# Patient Record
Sex: Female | Born: 1985 | Race: White | Hispanic: No | Marital: Single | Smoking: Never smoker
Health system: Southern US, Community
[De-identification: ages and names within clinical notes are randomized; demographics above are authoritative.]

## PROBLEM LIST (undated history)

## (undated) DIAGNOSIS — F909 Attention-deficit hyperactivity disorder, unspecified type: Secondary | ICD-10-CM

## (undated) HISTORY — PX: TONSILLECTOMY: SUR1361

## (undated) HISTORY — PX: APPENDECTOMY: SHX54

## (undated) HISTORY — PX: ABDOMINAL SURGERY: SHX537

---

## 2005-01-02 ENCOUNTER — Emergency Department: Payer: Self-pay | Admitting: Emergency Medicine

## 2008-10-28 ENCOUNTER — Inpatient Hospital Stay (HOSPITAL_COMMUNITY): Admission: AD | Admit: 2008-10-28 | Discharge: 2008-10-29 | Payer: Self-pay | Admitting: Obstetrics & Gynecology

## 2009-01-31 ENCOUNTER — Inpatient Hospital Stay (HOSPITAL_COMMUNITY): Admission: AD | Admit: 2009-01-31 | Discharge: 2009-02-03 | Payer: Self-pay | Admitting: Obstetrics and Gynecology

## 2009-03-15 ENCOUNTER — Emergency Department: Payer: Self-pay | Admitting: Emergency Medicine

## 2009-09-20 ENCOUNTER — Emergency Department: Payer: Self-pay | Admitting: Emergency Medicine

## 2009-12-17 ENCOUNTER — Ambulatory Visit (HOSPITAL_COMMUNITY): Admission: RE | Admit: 2009-12-17 | Discharge: 2009-12-17 | Payer: Self-pay | Admitting: Obstetrics

## 2010-01-04 ENCOUNTER — Emergency Department: Payer: Self-pay | Admitting: Emergency Medicine

## 2010-01-23 ENCOUNTER — Inpatient Hospital Stay (HOSPITAL_COMMUNITY): Admission: AD | Admit: 2010-01-23 | Discharge: 2010-01-23 | Payer: Self-pay | Admitting: Obstetrics

## 2010-01-23 ENCOUNTER — Ambulatory Visit: Payer: Self-pay | Admitting: Advanced Practice Midwife

## 2010-02-15 ENCOUNTER — Encounter: Admission: RE | Admit: 2010-02-15 | Discharge: 2010-02-15 | Payer: Self-pay | Admitting: Diagnostic Neuroimaging

## 2010-04-09 ENCOUNTER — Inpatient Hospital Stay (HOSPITAL_COMMUNITY): Admission: AD | Admit: 2010-04-09 | Discharge: 2010-04-09 | Payer: Self-pay | Admitting: Obstetrics

## 2010-04-09 ENCOUNTER — Ambulatory Visit: Payer: Self-pay | Admitting: Nurse Practitioner

## 2010-04-30 ENCOUNTER — Inpatient Hospital Stay (HOSPITAL_COMMUNITY): Admission: AD | Admit: 2010-04-30 | Discharge: 2010-05-01 | Payer: Self-pay | Admitting: Obstetrics

## 2010-05-19 ENCOUNTER — Inpatient Hospital Stay (HOSPITAL_COMMUNITY): Admission: RE | Admit: 2010-05-19 | Discharge: 2010-05-21 | Payer: Self-pay | Admitting: Obstetrics

## 2010-07-21 ENCOUNTER — Emergency Department: Payer: Self-pay | Admitting: Emergency Medicine

## 2010-11-14 ENCOUNTER — Encounter: Payer: Self-pay | Admitting: Diagnostic Neuroimaging

## 2011-01-08 LAB — CBC
HCT: 31.9 % — ABNORMAL LOW (ref 36.0–46.0)
Hemoglobin: 10.3 g/dL — ABNORMAL LOW (ref 12.0–15.0)
Hemoglobin: 11.1 g/dL — ABNORMAL LOW (ref 12.0–15.0)
MCHC: 34.7 g/dL (ref 30.0–36.0)
MCHC: 34.8 g/dL (ref 30.0–36.0)
Platelets: 140 10*3/uL — ABNORMAL LOW (ref 150–400)
RBC: 3.19 MIL/uL — ABNORMAL LOW (ref 3.87–5.11)
RBC: 3.5 MIL/uL — ABNORMAL LOW (ref 3.87–5.11)
RDW: 15.5 % (ref 11.5–15.5)

## 2011-01-09 ENCOUNTER — Emergency Department: Payer: Self-pay | Admitting: Emergency Medicine

## 2011-01-09 LAB — URINALYSIS, ROUTINE W REFLEX MICROSCOPIC
Ketones, ur: NEGATIVE mg/dL
Protein, ur: NEGATIVE mg/dL
Specific Gravity, Urine: >1.03 — ABNORMAL HIGH (ref 1.005–1.030)
Urobilinogen, UA: 0.2 mg/dL (ref 0.0–1.0)
pH: 6 (ref 5.0–8.0)

## 2011-01-09 LAB — URINE MICROSCOPIC-ADD ON

## 2011-01-09 LAB — WET PREP, GENITAL: Yeast Wet Prep HPF POC: NONE SEEN

## 2011-01-09 LAB — GC/CHLAMYDIA PROBE AMP, GENITAL
Chlamydia, DNA Probe: NEGATIVE
GC Probe Amp, Genital: NEGATIVE

## 2011-01-12 LAB — URINALYSIS, ROUTINE W REFLEX MICROSCOPIC
Glucose, UA: NEGATIVE mg/dL
Hgb urine dipstick: NEGATIVE
Protein, ur: NEGATIVE mg/dL
Specific Gravity, Urine: 1.01 (ref 1.005–1.030)
Urobilinogen, UA: 0.2 mg/dL (ref 0.0–1.0)

## 2011-01-12 LAB — WET PREP, GENITAL
Clue Cells Wet Prep HPF POC: NONE SEEN
Yeast Wet Prep HPF POC: NONE SEEN

## 2011-02-02 LAB — CBC
Hemoglobin: 10.4 g/dL — ABNORMAL LOW (ref 12.0–15.0)
MCHC: 34 g/dL (ref 30.0–36.0)
MCV: 94.2 fL (ref 78.0–100.0)
RBC: 3.23 MIL/uL — ABNORMAL LOW (ref 3.87–5.11)
RDW: 14.2 % (ref 11.5–15.5)
RDW: 14.3 % (ref 11.5–15.5)
WBC: 7 10*3/uL (ref 4.0–10.5)

## 2011-02-02 LAB — CCBB MATERNAL DONOR DRAW

## 2011-02-07 LAB — URINALYSIS, ROUTINE W REFLEX MICROSCOPIC
Glucose, UA: NEGATIVE mg/dL
Protein, ur: NEGATIVE mg/dL
Specific Gravity, Urine: >1.03 — ABNORMAL HIGH (ref 1.005–1.030)
pH: 6 (ref 5.0–8.0)

## 2011-03-08 NOTE — H&P (Signed)
Christy Mcdonald, Christy Mcdonald              ACCOUNT NO.:  [id number]   MEDICAL RECORD NO.:  [id number]          PATIENT TYPE:  INP   LOCATION:  9162                          FACILITY:  [facility]   PHYSICIAN:  Lenoard Aden, M.D.DATE OF BIRTH:  05/18/1986   DATE OF ADMISSION:  01/31/2009  DATE OF DISCHARGE:                              HISTORY & PHYSICAL   INDICATION FOR INDUCTION:  Postdates and presumed estimated fetal weight  of approximately 9 pounds.  She is a 25 year old Caucasian female, G1,  P0 at 79 plus weeks' gestation, who presents for cervical ripening and  induction.   SOCIAL HISTORY:  She has no known drug allergies.   MEDICATIONS:  Prenatal vitamins.   SOCIAL HISTORY:  She is a nonsmoker and nondrinker.  She denies domestic  or physical violence.   FAMILY HISTORY:  Hypertension, breast cancer, skin cancer, gallbladder  disease, seizure disorder, stroke, bronchitis, and COPD.   PAST MEDICAL HISTORY:  She also has a history of 2 previous TAbs in 2007  and 2009.  She is a G3, P0.   PHYSICAL EXAMINATION:  GENERAL:  She is a well-developed, well-nourished  white female in no acute distress.  HEENT:  Normal.  NECK:  Supple.  LUNGS:  Clear to auscultation.  HEART:  Regular rhythm.  ABDOMEN:  Soft, gravid, and nontender.  Estimated fetal weight 9 pounds.  Cervix is 1 cm, 70%, and vertex -2.  EXTREMITIES:  There are no cords.  NEUROLOGIC:  Nonfocal.  SKIN:  Intact.   IMPRESSION:  1. A 40 plus week intrauterine pregnancy.  2. Presumed large for gestational age.   PLAN:  Proceed with cervical ripening and induction.  Anticipate  cautious attempts at labor, and possible vaginal delivery.     Lenoard Aden, M.D.  Electronically Signed    RJT/MEDQ  D:  01/31/2009  T:  02/01/2009  Job:  [id number]

## 2011-03-10 ENCOUNTER — Emergency Department: Payer: Self-pay | Admitting: Emergency Medicine

## 2011-11-30 ENCOUNTER — Emergency Department: Payer: Self-pay | Admitting: Emergency Medicine

## 2013-01-19 ENCOUNTER — Emergency Department (HOSPITAL_COMMUNITY)
Admission: EM | Admit: 2013-01-19 | Discharge: 2013-01-19 | Disposition: A | Discharge status: Home / Self Care | Payer: 59 | Attending: Emergency Medicine | Admitting: Emergency Medicine

## 2013-01-19 ENCOUNTER — Encounter (HOSPITAL_COMMUNITY): Payer: Self-pay | Admitting: *Deleted

## 2013-01-19 DIAGNOSIS — G43909 Migraine, unspecified, not intractable, without status migrainosus: Secondary | ICD-10-CM | POA: Insufficient documentation

## 2013-01-19 DIAGNOSIS — H9209 Otalgia, unspecified ear: Secondary | ICD-10-CM | POA: Insufficient documentation

## 2013-01-19 DIAGNOSIS — H53149 Visual discomfort, unspecified: Secondary | ICD-10-CM | POA: Insufficient documentation

## 2013-01-19 MED ORDER — IBUPROFEN 800 MG PO TABS: 800.0000 mg | ORAL_TABLET | Freq: Three times a day (TID) | ORAL | Status: DC

## 2013-01-19 MED ORDER — DIPHENHYDRAMINE HCL 50 MG/ML IJ SOLN
25.0000 mg | Freq: Once | INTRAMUSCULAR | Status: AC
  Administered 2013-01-19: 25 mg via INTRAMUSCULAR
  Filled 2013-01-19: qty 1

## 2013-01-19 MED ORDER — PROCHLORPERAZINE EDISYLATE 5 MG/ML IJ SOLN: 10.0000 mg | Freq: Four times a day (QID) | INTRAMUSCULAR | Status: DC | PRN

## 2013-01-19 MED ORDER — SODIUM CHLORIDE 0.9 % IV BOLUS (SEPSIS)
1000.0000 mL | Freq: Once | INTRAVENOUS | Status: AC
  Administered 2013-01-19: 1000 mL via INTRAVENOUS

## 2013-01-19 MED ORDER — DEXAMETHASONE SODIUM PHOSPHATE 10 MG/ML IJ SOLN
10.0000 mg | Freq: Once | INTRAMUSCULAR | Status: AC
  Administered 2013-01-19: 10 mg via INTRAMUSCULAR
  Filled 2013-01-19: qty 1

## 2013-01-19 MED ORDER — KETOROLAC TROMETHAMINE 60 MG/2ML IM SOLN
60.0000 mg | Freq: Once | INTRAMUSCULAR | Status: AC
  Administered 2013-01-19: 60 mg via INTRAMUSCULAR
  Filled 2013-01-19: qty 2

## 2013-01-19 NOTE — ED Notes (Signed)
Headache for 3 hours no n v

## 2013-01-19 NOTE — ED Provider Notes (Signed)
History     CSN: [id number]  Arrival date & time 01/19/13  0310   First MD Initiated Contact with Patient 01/19/13 [phone]      Chief Complaint  Patient presents with  . Headache    (Consider location/radiation/quality/duration/timing/severity/associated sxs/prior treatment) Patient is a 27 y.o. female presenting with headaches. The history is provided by the patient. No language interpreter was used.  Headache Pain location:  R temporal and R parietal Quality:  Sharp and stabbing Radiates to:  Does not radiate Severity currently:  9/10 Severity at highest:  10/10 Onset quality:  Sudden Duration:  3 hours Timing:  Constant Progression:  Unchanged Chronicity:  Recurrent Similar to prior headaches: no (pt states much worse than normal)   Context: bright light   Relieved by:  Nothing Worsened by:  Light Ineffective treatments:  Acetaminophen Associated symptoms: ear pain ( right ear pain) and photophobia   Associated symptoms: no abdominal pain, no back pain, no blurred vision, no congestion, no cough, no diarrhea, no pain, no fatigue, no fever, no focal weakness, no hearing loss, no loss of balance, no myalgias, no nausea, no neck pain, no neck stiffness, no seizures, no sinus pressure, no sore throat, no swollen glands, no syncope, no visual change, no vomiting and no weakness   Pt with hx of migraines presented today after 3hr hx of right sided HA worse than other migraines in the past.  Pt was sitting in bed when HA began.  Pain is 10/10, sharp and throbbing in nature, causing right ear to hurt.  Tried acetaminophen w/o relief.  Denies fever, chills, n/v/d.  Denies neck pain. Denies change in vision or loss of balance.  History reviewed. No pertinent past medical history.  History reviewed. No pertinent past surgical history.  No family history on file.  History  Substance Use Topics  . Smoking status: Never Smoker   . Smokeless tobacco: Not on file  . Alcohol Use: Yes     OB History   Grav Para Term Preterm Abortions TAB SAB Ect Mult Living                  Review of Systems  Constitutional: Negative for fever and fatigue.  HENT: Positive for ear pain ( right ear pain). Negative for hearing loss, congestion, sore throat, neck pain, neck stiffness and sinus pressure.   Eyes: Positive for photophobia. Negative for blurred vision and pain.  Respiratory: Negative for cough.   Cardiovascular: Negative for syncope.  Gastrointestinal: Negative for nausea, vomiting, abdominal pain and diarrhea.  Musculoskeletal: Negative for myalgias and back pain.  Neurological: Positive for headaches. Negative for focal weakness, seizures and loss of balance.    Allergies  Review of patient's allergies indicates no known allergies.  Home Medications   Current Outpatient Rx  Name  Route  Sig  Dispense  Refill  . ibuprofen (ADVIL,MOTRIN) 800 MG tablet   Oral   Take 1 tablet (800 mg total) by mouth 3 (three) times daily.   21 tablet   0     BP 139/86  Pulse 80  Temp(Src) 97.7 F (36.5 C) (Oral)  Resp 18  SpO2 97%  Physical Exam  Nursing note and vitals reviewed. Constitutional: She is oriented to person, place, and time. She appears well-developed and well-nourished. No distress.  HENT:  Head: Normocephalic and atraumatic.  Right Ear: External ear normal.  Left Ear: External ear normal.  Mouth/Throat: Oropharynx is clear and moist. No oropharyngeal exudate.  Eyes: Conjunctivae  and EOM are normal. Pupils are equal, round, and reactive to light. No scleral icterus.  Neck: Normal range of motion. Neck supple. No tracheal deviation present. No thyromegaly present.  Cardiovascular: Normal rate, regular rhythm and normal heart sounds.   Pulmonary/Chest: Effort normal and breath sounds normal. No respiratory distress. She has no wheezes.  Abdominal: Soft. Bowel sounds are normal. She exhibits no distension. There is no tenderness.  Musculoskeletal: Normal  range of motion.  Lymphadenopathy:    She has no cervical adenopathy.  Neurological: She is alert and oriented to person, place, and time. She has normal reflexes. She displays normal reflexes. No cranial nerve deficit. Coordination normal.  Skin: Skin is warm and dry. No rash noted. She is not diaphoretic. No erythema.    ED Course  Procedures (including critical care time)  Labs Reviewed - No data to display No results found.   1. Migraine       MDM  Pt presenting with 3hr hx of right sided HA.  Pt has hx of migraines but states this is much worse.  Was lying in bed when HA began. Also c/o right ear pain.  Has taken acetaminophen with no relief.  Denies fever, chills, n/v/d.  No visual change.  Pt A&Ox3.  Pt uses Mirena for birth control, not OCP.  No nuchal rigidity.  Afebrile. No rash. Eyes: PERRL, EOM in tact Neuro: nl, CN II-XII in tact, neg romberg, nl gait, nl coordination  Giving pain meds: decadron, compazine, and benadryl.   Not likely to be SAH or intracranial due to no neuro deficits or hx of trauma.   Most likely typical migraine.  Will reassess.  5:44 AM Pt visibly upset, crying in bed.  Will give fluid bolus and Toradol.  5:44 AM Pt states she is feeling a lot better.  Pain went from 10/10 to 4/10.  Will let fluids continue and discharge pt home.  Will have pt f/u with PCP for migraines.  Advised pt to rest and stay hydrated.  Important to keep stress under control.    Dx: migraine  Rx: ibuprofen  Vitals: unremarkable. Discharged in stable condition.    Discussed pt with attending during ED encounter.           Junius Finner, PA-C 01/19/13 [phone]

## 2013-02-06 NOTE — ED Provider Notes (Signed)
Medical screening examination/treatment/procedure(s) were performed by non-physician practitioner and as supervising physician I was immediately available for consultation/collaboration.  Brandt Loosen, MD 02/06/13 [phone]

## 2013-03-21 ENCOUNTER — Emergency Department (HOSPITAL_COMMUNITY)
Admission: EM | Admit: 2013-03-21 | Discharge: 2013-03-22 | Disposition: A | Discharge status: Home / Self Care | Payer: 59 | Attending: Emergency Medicine | Admitting: Emergency Medicine

## 2013-03-21 ENCOUNTER — Encounter (HOSPITAL_COMMUNITY): Payer: Self-pay

## 2013-03-21 DIAGNOSIS — N83209 Unspecified ovarian cyst, unspecified side: Secondary | ICD-10-CM | POA: Insufficient documentation

## 2013-03-21 DIAGNOSIS — Z3202 Encounter for pregnancy test, result negative: Secondary | ICD-10-CM | POA: Insufficient documentation

## 2013-03-21 NOTE — ED Notes (Signed)
Pt states that she's having severe pelvic pain and she thinks its her IUD, she's unable to find the string

## 2013-03-22 ENCOUNTER — Emergency Department (HOSPITAL_COMMUNITY): Payer: 59

## 2013-03-22 LAB — GC/CHLAMYDIA PROBE AMP: GC Probe RNA: NEGATIVE

## 2013-03-22 LAB — URINALYSIS, ROUTINE W REFLEX MICROSCOPIC
Glucose, UA: NEGATIVE mg/dL
Ketones, ur: NEGATIVE mg/dL
Leukocytes, UA: NEGATIVE
Specific Gravity, Urine: 1.031 — ABNORMAL HIGH (ref 1.005–1.030)
Urobilinogen, UA: 0.2 mg/dL (ref 0.0–1.0)
pH: 5 (ref 5.0–8.0)

## 2013-03-22 LAB — PREGNANCY, URINE: Preg Test, Ur: NEGATIVE

## 2013-03-22 MED ORDER — OXYCODONE-ACETAMINOPHEN 5-325 MG PO TABS
2.0000 | ORAL_TABLET | Freq: Once | ORAL | Status: AC
  Administered 2013-03-22: 2 via ORAL
  Filled 2013-03-22: qty 2

## 2013-03-22 MED ORDER — HYDROCODONE-ACETAMINOPHEN 5-325 MG PO TABS: 1.0000 | ORAL_TABLET | ORAL | Status: DC | PRN

## 2013-03-22 MED ORDER — ONDANSETRON 4 MG PO TBDP
4.0000 mg | ORAL_TABLET | Freq: Once | ORAL | Status: AC
  Administered 2013-03-22: 4 mg via ORAL
  Filled 2013-03-22: qty 1

## 2013-03-22 MED ORDER — ONDANSETRON HCL 4 MG PO TABS: 4.0000 mg | ORAL_TABLET | Freq: Four times a day (QID) | ORAL | Status: DC

## 2013-03-22 NOTE — ED Provider Notes (Signed)
Medical screening examination/treatment/procedure(s) were performed by non-physician practitioner and as supervising physician I was immediately available for consultation/collaboration.  Olivia Mackie, MD 03/22/13 [phone]

## 2013-03-22 NOTE — ED Provider Notes (Signed)
History     CSN: [id number]  Arrival date & time 03/21/13  2327   First MD Initiated Contact with Patient 03/22/13 0011      Chief Complaint  Patient presents with  . Pelvic Pain    (Consider location/radiation/quality/duration/timing/severity/associated sxs/prior treatment) HPI  Patient to the ER with complaints of acute pelvic pain that started a couple of hours before arriving to the ED. She has an IUD and is unable to feel the string which has her concerned that it is out of place. She denies having discharge, vaginal bleeding, dysuria, vomiting, diarrhea, upper abdominal pains. nad vss   History reviewed. No pertinent past medical history.  History reviewed. No pertinent past surgical history.  History reviewed. No pertinent family history.  History  Substance Use Topics  . Smoking status: Never Smoker   . Smokeless tobacco: Not on file  . Alcohol Use: Yes    OB History   Grav Para Term Preterm Abortions TAB SAB Ect Mult Living                  Review of Systems  Genitourinary: Positive for pelvic pain.  All other systems reviewed and are negative.    Allergies  Review of patient's allergies indicates no known allergies.  Home Medications   Current Outpatient Rx  Name  Route  Sig  Dispense  Refill  . ibuprofen (ADVIL,MOTRIN) 200 MG tablet   Oral   Take 400-800 mg by mouth every 6 (six) hours as needed for pain.         Marland Kitchen HYDROcodone-acetaminophen (NORCO/VICODIN) 5-325 MG per tablet   Oral   Take 1 tablet by mouth every 4 (four) hours as needed for pain.   10 tablet   0   . ondansetron (ZOFRAN) 4 MG tablet   Oral   Take 1 tablet (4 mg total) by mouth every 6 (six) hours.   12 tablet   0     BP 125/74  Pulse 65  Temp(Src) 98.4 F (36.9 C) (Oral)  Resp 16  SpO2 95%  Physical Exam  Nursing note and vitals reviewed. Constitutional: She appears well-developed and well-nourished. No distress.  HENT:  Head: Normocephalic and atraumatic.   Eyes: Pupils are equal, round, and reactive to light.  Neck: Normal range of motion. Neck supple.  Cardiovascular: Normal rate and regular rhythm.   Pulmonary/Chest: Effort normal.  Abdominal: Soft.  Genitourinary: Vagina normal and uterus normal. Cervix exhibits no motion tenderness, no discharge and no friability. No tenderness around the vagina. No vaginal discharge found.  Neurological: She is alert.  Skin: Skin is warm and dry.    ED Course  Procedures (including critical care time)  Labs Reviewed  WET PREP, GENITAL - Abnormal; Notable for the following:    Clue Cells Wet Prep HPF POC RARE (*)    WBC, Wet Prep HPF POC RARE (*)    All other components within normal limits  URINALYSIS, ROUTINE W REFLEX MICROSCOPIC - Abnormal; Notable for the following:    Specific Gravity, Urine 1.031 (*)    All other components within normal limits  GC/CHLAMYDIA PROBE AMP  PREGNANCY, URINE   US Transvaginal Non-ob  03/22/2013   *RADIOLOGY REPORT*  Clinical Data: Pelvic pain.  TRANSABDOMINAL AND TRANSVAGINAL ULTRASOUND OF PELVIS DOPPLER ULTRASOUND OF OVARIES  Technique:  Both transabdominal and transvaginal ultrasound examinations of the pelvis were performed. Transabdominal technique was performed for global imaging of the pelvis including uterus, ovaries, adnexal regions, and pelvic cul-de-sac.  Color and duplex Doppler ultrasound was utilized to evaluate blood flow to the ovaries.  It was necessary to proceed with endovaginal exam following the transabdominal exam to visualize the uterus and ovaries in greater detail.  Comparison:  Pelvic ultrasound performed 12/17/2009  Findings:  Uterus: Normal in size and appearance; measures 9.8 x 3.5 x 5.5 cm.  Endometrium: Normal in thickness and appearance; measures 0.6 cm in thickness. An intrauterine device is noted in appropriate position.  Right ovary:  Normal appearance/no adnexal mass; measures 3.1 x 2.3 x 2.4 cm.  Left ovary: Normal appearance; measures  4.8 x 2.8 x 3.0 cm.  There is a 2.5 x 1.9 x 1.5 cm irregular cyst with debris, possibly reflecting a recently ruptured cyst.  Pulsed Doppler evaluation demonstrates normal low-resistance arterial and venous waveforms in both ovaries.  Other findings:  A moderate amount of free fluid is noted within the pelvis.  IMPRESSION:  1.  2.5 cm somewhat irregular cyst with debris noted at the left ovary, possibly reflecting a recently ruptured cyst, with a moderate amount of free fluid noted in the pelvis. 2.  Otherwise unremarkable pelvic ultrasound.  Intrauterine device noted in expected position.  No evidence for ovarian torsion.   Original Report Authenticated By: Tonia Ghent, M.D.   US Pelvis Complete  03/22/2013   *RADIOLOGY REPORT*  Clinical Data: Pelvic pain.  TRANSABDOMINAL AND TRANSVAGINAL ULTRASOUND OF PELVIS DOPPLER ULTRASOUND OF OVARIES  Technique:  Both transabdominal and transvaginal ultrasound examinations of the pelvis were performed. Transabdominal technique was performed for global imaging of the pelvis including uterus, ovaries, adnexal regions, and pelvic cul-de-sac.  Color and duplex Doppler ultrasound was utilized to evaluate blood flow to the ovaries.  It was necessary to proceed with endovaginal exam following the transabdominal exam to visualize the uterus and ovaries in greater detail.  Comparison:  Pelvic ultrasound performed 12/17/2009  Findings:  Uterus: Normal in size and appearance; measures 9.8 x 3.5 x 5.5 cm.  Endometrium: Normal in thickness and appearance; measures 0.6 cm in thickness. An intrauterine device is noted in appropriate position.  Right ovary:  Normal appearance/no adnexal mass; measures 3.1 x 2.3 x 2.4 cm.  Left ovary: Normal appearance; measures 4.8 x 2.8 x 3.0 cm.  There is a 2.5 x 1.9 x 1.5 cm irregular cyst with debris, possibly reflecting a recently ruptured cyst.  Pulsed Doppler evaluation demonstrates normal low-resistance arterial and venous waveforms in both  ovaries.  Other findings:  A moderate amount of free fluid is noted within the pelvis.  IMPRESSION:  1.  2.5 cm somewhat irregular cyst with debris noted at the left ovary, possibly reflecting a recently ruptured cyst, with a moderate amount of free fluid noted in the pelvis. 2.  Otherwise unremarkable pelvic ultrasound.  Intrauterine device noted in expected position.  No evidence for ovarian torsion.   Original Report Authenticated By: Tonia Ghent, M.D.   [place] Art/ven Flow Abd Pelv Doppler  03/22/2013   *RADIOLOGY REPORT*  Clinical Data: Pelvic pain.  TRANSABDOMINAL AND TRANSVAGINAL ULTRASOUND OF PELVIS DOPPLER ULTRASOUND OF OVARIES  Technique:  Both transabdominal and transvaginal ultrasound examinations of the pelvis were performed. Transabdominal technique was performed for global imaging of the pelvis including uterus, ovaries, adnexal regions, and pelvic cul-de-sac.  Color and duplex Doppler ultrasound was utilized to evaluate blood flow to the ovaries.  It was necessary to proceed with endovaginal exam following the transabdominal exam to visualize the uterus and ovaries in greater detail.  Comparison:  Pelvic ultrasound  performed 12/17/2009  Findings:  Uterus: Normal in size and appearance; measures 9.8 x 3.5 x 5.5 cm.  Endometrium: Normal in thickness and appearance; measures 0.6 cm in thickness. An intrauterine device is noted in appropriate position.  Right ovary:  Normal appearance/no adnexal mass; measures 3.1 x 2.3 x 2.4 cm.  Left ovary: Normal appearance; measures 4.8 x 2.8 x 3.0 cm.  There is a 2.5 x 1.9 x 1.5 cm irregular cyst with debris, possibly reflecting a recently ruptured cyst.  Pulsed Doppler evaluation demonstrates normal low-resistance arterial and venous waveforms in both ovaries.  Other findings:  A moderate amount of free fluid is noted within the pelvis.  IMPRESSION:  1.  2.5 cm somewhat irregular cyst with debris noted at the left ovary, possibly reflecting a recently ruptured  cyst, with a moderate amount of free fluid noted in the pelvis. 2.  Otherwise unremarkable pelvic ultrasound.  Intrauterine device noted in expected position.  No evidence for ovarian torsion.   Original Report Authenticated By: Tonia Ghent, M.D.     1. Ovarian cyst rupture       MDM  Patients pain relieved after 1 dose of pain medication and did not return. US shows that IUD is in place and that she has evidence of an ovarian cyst rupture. [facility] give Rx for pain medication [facility]   Pt has been advised of the symptoms that warrant their return to the ED. Patient has voiced understanding and has agreed to follow-up with the PCP or specialist.         Dorthula Matas, PA-C 03/22/13 [phone]

## 2013-04-10 ENCOUNTER — Other Ambulatory Visit: Payer: Self-pay | Admitting: Occupational Medicine

## 2013-04-10 ENCOUNTER — Ambulatory Visit: Payer: Self-pay

## 2013-04-10 DIAGNOSIS — R52 Pain, unspecified: Secondary | ICD-10-CM

## 2013-10-14 ENCOUNTER — Ambulatory Visit (INDEPENDENT_AMBULATORY_CARE_PROVIDER_SITE_OTHER): Payer: 59 | Admitting: Physician Assistant

## 2013-10-14 VITALS — BP 130/90 | HR 86 | Temp 98.0°F | Resp 16 | Ht 67.0 in | Wt 253.0 lb

## 2013-10-14 DIAGNOSIS — J029 Acute pharyngitis, unspecified: Secondary | ICD-10-CM

## 2013-10-14 DIAGNOSIS — J069 Acute upper respiratory infection, unspecified: Secondary | ICD-10-CM

## 2013-10-14 LAB — POCT RAPID STREP A (OFFICE): Rapid Strep A Screen: NEGATIVE

## 2013-10-14 MED ORDER — IPRATROPIUM BROMIDE 0.06 % NA SOLN: 2.0000 | Freq: Three times a day (TID) | NASAL | Status: DC

## 2013-10-14 MED ORDER — GUAIFENESIN ER 1200 MG PO TB12: 1.0000 | ORAL_TABLET | Freq: Two times a day (BID) | ORAL | Status: AC

## 2013-10-14 MED ORDER — HYDROCOD POLST-CHLORPHEN POLST 10-8 MG/5ML PO LQCR: 5.0000 mL | Freq: Two times a day (BID) | ORAL | Status: AC

## 2013-10-14 NOTE — Progress Notes (Signed)
   Subjective:    Patient ID: Christy Mcdonald, female    DOB: 06-23-1986, 27 y.o.   MRN: [id number]  HPI Pt presents to clinic with 3-4 day h/o cold symptoms.  Started as a sore throat that has not improved.  It is bad all day.  She also has congestion and green rhinorrhea with PND.  She has started to lose her voice - she is not sleeping great because of her congestion and ear pain.  The ear pain on the right side only started yesterday.  OTC meds - Dimetapp Sick contacts - none No flu vaccine Review of Systems  Constitutional: Negative for chills. Fever: subjective.  HENT: Positive for congestion, ear pain (Right side only), postnasal drip, rhinorrhea (green) and sore throat.   Respiratory: Positive for cough (dry mostly).   Gastrointestinal: Negative for nausea and diarrhea.  Musculoskeletal: Negative for myalgias.  Neurological: Negative for headaches.       Objective:   Physical Exam  Constitutional: She appears well-developed and well-nourished.  HENT:  Head: Normocephalic and atraumatic.  Right Ear: Hearing, tympanic membrane, external ear and ear canal normal.  Left Ear: Tympanic membrane, external ear and ear canal normal.  Nose: Mucosal edema (red) present.  Mouth/Throat: Uvula is midline, oropharynx is clear and moist and mucous membranes are normal.  Eyes: Conjunctivae are normal.  Cardiovascular: Normal rate, regular rhythm and normal heart sounds.   No murmur heard. Pulmonary/Chest: Effort normal and breath sounds normal.   Results for orders placed in visit on 10/14/13  POCT RAPID STREP A (OFFICE)      Result Value Range   Rapid Strep A Screen Negative  Negative        Assessment & Plan:  Viral URI with cough - Plan: Guaifenesin (MUCINEX MAXIMUM STRENGTH) 1200 MG TB12, ipratropium (ATROVENT) 0.06 % nasal spray, chlorpheniramine-HYDROcodone (TUSSIONEX PENNKINETIC ER) 10-8 MG/5ML LQCR  Sore throat - Plan: POCT rapid strep A  Push fluids - tylenol,motrin  prn.  Benny Lennert PA-C 10/14/2013 1:10 PM

## 2013-10-14 NOTE — Patient Instructions (Signed)
Please push fluids.  Tylenol and Motrin for fever and body aches.   Motrin 200mg  (you can take 4 pills 3x/day for your ear pain and throat pain)

## 2014-07-16 ENCOUNTER — Ambulatory Visit (INDEPENDENT_AMBULATORY_CARE_PROVIDER_SITE_OTHER): Payer: 59 | Admitting: Family Medicine

## 2014-07-16 VITALS — BP 106/78 | HR 87 | Temp 98.5°F | Resp 16 | Ht 66.75 in | Wt 244.0 lb

## 2014-07-16 DIAGNOSIS — H6591 Unspecified nonsuppurative otitis media, right ear: Secondary | ICD-10-CM

## 2014-07-16 DIAGNOSIS — H6590 Unspecified nonsuppurative otitis media, unspecified ear: Secondary | ICD-10-CM

## 2014-07-16 DIAGNOSIS — J069 Acute upper respiratory infection, unspecified: Secondary | ICD-10-CM

## 2014-07-16 DIAGNOSIS — B9789 Other viral agents as the cause of diseases classified elsewhere: Secondary | ICD-10-CM

## 2014-07-16 MED ORDER — AMOXICILLIN 500 MG PO CAPS: 1000.0000 mg | ORAL_CAPSULE | Freq: Two times a day (BID) | ORAL | Status: DC

## 2014-07-16 MED ORDER — IPRATROPIUM BROMIDE 0.06 % NA SOLN: 2.0000 | Freq: Three times a day (TID) | NASAL | Status: DC

## 2014-07-16 NOTE — Progress Notes (Signed)
[facility] [address], [place] [address] [phone]  Date:  07/16/2014   Name:  Christy Mcdonald   DOB:  03-Jan-1986   MRN:  [id number]  PCP:  No PCP Per Patient    Chief Complaint: Sore Throat, Otalgia and Sinus Congestion   History of Present Illness:  Christy Mcdonald is a 28 y.o. very pleasant female patient who presents with the following:  She has been sick for about 4 days with cold type sx. She also notes right ear pain and a st since yesterday.   She does have mild cough, but no fever. No GI sx.  She is generally in good health.  IUD.  No sick contacts.   Never a smoker.   There are no active problems to display for this patient.   History reviewed. No pertinent past medical history.  History reviewed. No pertinent past surgical history.  History  Substance Use Topics  . Smoking status: Never Smoker   . Smokeless tobacco: Not on file  . Alcohol Use: Yes    History reviewed. No pertinent family history.  No Known Allergies  Medication list has been reviewed and updated.  Current Outpatient Prescriptions on File Prior to Visit  Medication Sig Dispense Refill  . ipratropium (ATROVENT) 0.06 % nasal spray Place 2 sprays into the nose 3 (three) times daily.  15 mL  0   No current facility-administered medications on file prior to visit.    Review of Systems:  As per HPI- otherwise negative.   Physical Examination: Filed Vitals:   07/16/14 0810  BP: 106/78  Pulse: 87  Temp: 98.5 F (36.9 C)  Resp: 16   Filed Vitals:   07/16/14 0810  Height: 5' 6.75" (1.695 m)  Weight: 244 lb (110.678 kg)   Body mass index is 38.52 kg/(m^2). Ideal Body Weight: Weight in (lb) to have BMI = 25: 158.1  GEN: WDWN, NAD, Non-toxic, A & O x 3, obese, looks well HEENT: Atraumatic, Normocephalic. Neck supple. No masses, No LAD.  Left TM wnl, right TM is injected, oropharynx normal.  PEERL,EOMI.   Ears and Nose: No external deformity. CV:  RRR, No M/G/R. No JVD. No thrill. No extra heart sounds. PULM: CTA B, no wheezes, crackles, rhonchi. No retractions. No resp. distress. No accessory muscle use. ABD: S, NT, ND EXTR: No c/c/e NEURO Normal gait.  PSYCH: Normally interactive. Conversant. Not depressed or anxious appearing.  Calm demeanor.    Assessment and Plan: OME (otitis media with effusion), right - Plan: amoxicillin (AMOXIL) 500 MG capsule  Viral URI with cough - Plan: ipratropium (ATROVENT) 0.06 % nasal spray  Treat for OM with amoxicillin as above, See patient instructions for more details.     Signed Abbe Amsterdam, MD

## 2014-07-16 NOTE — Patient Instructions (Signed)
Use the amoxillin as directed for ear infection. You can also use tylenol or ibuprofen as needed.  Let me know if you are not better in 2-3 days- Sooner if worse.

## 2017-02-03 ENCOUNTER — Observation Stay
Admission: EM | Admit: 2017-02-03 | Discharge: 2017-02-05 | Disposition: A | Discharge status: Home / Self Care | Payer: 59 | Attending: Surgery | Admitting: Surgery

## 2017-02-03 ENCOUNTER — Encounter: Payer: Self-pay | Admitting: Emergency Medicine

## 2017-02-03 ENCOUNTER — Emergency Department: Payer: 59

## 2017-02-03 DIAGNOSIS — K219 Gastro-esophageal reflux disease without esophagitis: Secondary | ICD-10-CM | POA: Insufficient documentation

## 2017-02-03 DIAGNOSIS — R1031 Right lower quadrant pain: Secondary | ICD-10-CM | POA: Insufficient documentation

## 2017-02-03 DIAGNOSIS — Z975 Presence of (intrauterine) contraceptive device: Secondary | ICD-10-CM | POA: Insufficient documentation

## 2017-02-03 DIAGNOSIS — Z6841 Body Mass Index (BMI) 40.0 and over, adult: Secondary | ICD-10-CM | POA: Diagnosis not present

## 2017-02-03 DIAGNOSIS — F909 Attention-deficit hyperactivity disorder, unspecified type: Secondary | ICD-10-CM | POA: Insufficient documentation

## 2017-02-03 DIAGNOSIS — R109 Unspecified abdominal pain: Secondary | ICD-10-CM | POA: Diagnosis present

## 2017-02-03 DIAGNOSIS — K3580 Unspecified acute appendicitis: Secondary | ICD-10-CM | POA: Diagnosis not present

## 2017-02-03 HISTORY — DX: Attention-deficit hyperactivity disorder, unspecified type: F90.9

## 2017-02-03 LAB — URINALYSIS, COMPLETE (UACMP) WITH MICROSCOPIC
BILIRUBIN URINE: NEGATIVE
Bilirubin Urine: NEGATIVE
GLUCOSE, UA: NEGATIVE mg/dL
Glucose, UA: NEGATIVE mg/dL
KETONES UR: 20 mg/dL — AB
KETONES UR: 80 mg/dL — AB
Leukocytes, UA: NEGATIVE
Nitrite: NEGATIVE
Nitrite: NEGATIVE
PH: 5 (ref 5.0–8.0)
PROTEIN: 30 mg/dL — AB
PROTEIN: NEGATIVE mg/dL
Specific Gravity, Urine: 1.03 (ref 1.005–1.030)
Specific Gravity, Urine: 1.032 — ABNORMAL HIGH (ref 1.005–1.030)
pH: 5 (ref 5.0–8.0)

## 2017-02-03 LAB — COMPREHENSIVE METABOLIC PANEL
ALBUMIN: 4.2 g/dL (ref 3.5–5.0)
ALT: 28 U/L (ref 14–54)
AST: 23 U/L (ref 15–41)
Alkaline Phosphatase: 91 U/L (ref 38–126)
Anion gap: 11 (ref 5–15)
BUN: 14 mg/dL (ref 6–20)
CHLORIDE: 105 mmol/L (ref 101–111)
CO2: 21 mmol/L — ABNORMAL LOW (ref 22–32)
Calcium: 9 mg/dL (ref 8.9–10.3)
Creatinine, Ser: 0.68 mg/dL (ref 0.44–1.00)
GFR calc Af Amer: >60 mL/min (ref >60)
Glucose, Bld: 94 mg/dL (ref 65–99)
POTASSIUM: 3.6 mmol/L (ref 3.5–5.1)
Sodium: 137 mmol/L (ref 135–145)
Total Bilirubin: 0.9 mg/dL (ref 0.3–1.2)
Total Protein: 7.5 g/dL (ref 6.5–8.1)

## 2017-02-03 LAB — WET PREP, GENITAL
Clue Cells Wet Prep HPF POC: NONE SEEN
Sperm: NONE SEEN
Trich, Wet Prep: NONE SEEN
YEAST WET PREP: NONE SEEN

## 2017-02-03 LAB — CBC
HEMATOCRIT: 41.2 % (ref 35.0–47.0)
HEMOGLOBIN: 14.4 g/dL (ref 12.0–16.0)
MCH: 32.3 pg (ref 26.0–34.0)
MCHC: 34.9 g/dL (ref 32.0–36.0)
MCV: 92.4 fL (ref 80.0–100.0)
PLATELETS: 216 10*3/uL (ref 150–440)
RBC: 4.45 MIL/uL (ref 3.80–5.20)
RDW: 14 % (ref 11.5–14.5)
WBC: 10.3 10*3/uL (ref 3.6–11.0)

## 2017-02-03 LAB — GLUCOSE, CAPILLARY: Glucose-Capillary: 113 mg/dL — ABNORMAL HIGH (ref 65–99)

## 2017-02-03 LAB — LIPASE, BLOOD: LIPASE: 18 U/L (ref 11–51)

## 2017-02-03 LAB — CHLAMYDIA/NGC RT PCR (ARMC ONLY)
Chlamydia Tr: NOT DETECTED
N GONORRHOEAE: NOT DETECTED

## 2017-02-03 LAB — POCT PREGNANCY, URINE: Preg Test, Ur: NEGATIVE

## 2017-02-03 MED ORDER — DIPHENHYDRAMINE HCL 50 MG/ML IJ SOLN
25.0000 mg | Freq: Once | INTRAMUSCULAR | Status: AC
  Administered 2017-02-03: 25 mg via INTRAVENOUS

## 2017-02-03 MED ORDER — DIPHENHYDRAMINE HCL 50 MG/ML IJ SOLN
INTRAMUSCULAR | Status: AC
  Administered 2017-02-03: 25 mg via INTRAVENOUS
  Filled 2017-02-03: qty 1

## 2017-02-03 MED ORDER — ONDANSETRON HCL 4 MG/2ML IJ SOLN: 4.0000 mg | Freq: Once | INTRAMUSCULAR | Status: AC

## 2017-02-03 MED ORDER — SODIUM CHLORIDE 0.9 % IV BOLUS (SEPSIS)
1000.0000 mL | Freq: Once | INTRAVENOUS | Status: AC
  Administered 2017-02-03: 1000 mL via INTRAVENOUS

## 2017-02-03 MED ORDER — HYDROMORPHONE HCL 1 MG/ML IJ SOLN
INTRAMUSCULAR | Status: AC
  Administered 2017-02-03: 1 mg via INTRAVENOUS
  Filled 2017-02-03: qty 1

## 2017-02-03 MED ORDER — ONDANSETRON HCL 4 MG/2ML IJ SOLN
INTRAMUSCULAR | Status: AC
  Administered 2017-02-03: 4 mg via INTRAVENOUS
  Filled 2017-02-03: qty 2

## 2017-02-03 MED ORDER — ONDANSETRON HCL 4 MG/2ML IJ SOLN
4.0000 mg | Freq: Four times a day (QID) | INTRAMUSCULAR | Status: DC | PRN
Start: 2017-02-03 — End: 2017-02-05

## 2017-02-03 MED ORDER — DIPHENHYDRAMINE HCL 50 MG/ML IJ SOLN: 12.5000 mg | Freq: Four times a day (QID) | INTRAMUSCULAR | Status: DC | PRN

## 2017-02-03 MED ORDER — KETOROLAC TROMETHAMINE 30 MG/ML IJ SOLN
30.0000 mg | Freq: Four times a day (QID) | INTRAMUSCULAR | Status: DC | PRN
  Administered 2017-02-04 – 2017-02-05 (×4): 30 mg via INTRAVENOUS
  Filled 2017-02-03 (×4): qty 1

## 2017-02-03 MED ORDER — ONDANSETRON 4 MG PO TBDP
4.0000 mg | ORAL_TABLET | Freq: Once | ORAL | Status: AC | PRN
  Administered 2017-02-03: 4 mg via ORAL
  Filled 2017-02-03: qty 1

## 2017-02-03 MED ORDER — MORPHINE SULFATE (PF) 4 MG/ML IV SOLN
4.0000 mg | INTRAVENOUS | Status: DC | PRN
  Administered 2017-02-04 (×3): 4 mg via INTRAVENOUS
  Filled 2017-02-03 (×3): qty 1

## 2017-02-03 MED ORDER — DIPHENHYDRAMINE HCL 12.5 MG/5ML PO ELIX
12.5000 mg | ORAL_SOLUTION | Freq: Four times a day (QID) | ORAL | Status: DC | PRN
  Filled 2017-02-03: qty 5

## 2017-02-03 MED ORDER — LACTATED RINGERS IV SOLN
INTRAVENOUS | Status: DC
  Administered 2017-02-04 (×3): via INTRAVENOUS

## 2017-02-03 MED ORDER — ENOXAPARIN SODIUM 40 MG/0.4ML ~~LOC~~ SOLN
40.0000 mg | SUBCUTANEOUS | Status: DC
  Administered 2017-02-04 (×2): 40 mg via SUBCUTANEOUS
  Filled 2017-02-03 (×3): qty 0.4

## 2017-02-03 MED ORDER — HYDROMORPHONE HCL 1 MG/ML IJ SOLN: 1.0000 mg | Freq: Once | INTRAMUSCULAR | Status: AC

## 2017-02-03 MED ORDER — ONDANSETRON 4 MG PO TBDP: 4.0000 mg | ORAL_TABLET | Freq: Four times a day (QID) | ORAL | Status: DC | PRN

## 2017-02-03 NOTE — H&P (Addendum)
Patient ID: Christy Mcdonald, female   DOB: July 13, 1986, 32 y.o.   MRN: [id number]  HPI Christy Mcdonald is a 31 y.o. female in for acute abdominal pain. She states that the pain started last night and last few hours has intensified and located to the right lower quadrant. Patient reports that the pain is sharp moderate in intensity and no specific alleviating or aggravating factors. The pain subsided to narcotics. Pain is not radiated She had some nausea and vomiting but currently she is home somehow hungry. No fevers or chills. No previous abdominal operations. CT scan personally reviewed and discussed with the radiologist Dr. Phill Myron . There is very subtle stranding around the appendix but no definitive inflammatory response within the appendix and there is a normal caliber. There is no free air there is no abscess. Upon questioning the radiologist thought that she may have more of a mesenteric adenitis picture. Her white count is normal and her pelvic exam  ( performed by ER MD)was also normal. BMP is normal. Chlamydia neg. I have personally reviewed the patient's imaging, laboratory findings and medical records.     HPI  Past Medical History:  Diagnosis Date  . ADHD     Past Surgical History:  Procedure Laterality Date  . TONSILLECTOMY      No family history on file.  Social History Social History  Substance Use Topics  . Smoking status: Never Smoker  . Smokeless tobacco: Never Used  . Alcohol use Yes    No Known Allergies  Current Facility-Administered Medications  Medication Dose Route Frequency Provider Last Rate Last Dose  . sodium chloride 0.9 % bolus 1,000 mL  1,000 mL Intravenous Once Leafy Ro, MD       Current Outpatient Prescriptions  Medication Sig Dispense Refill  . amoxicillin (AMOXIL) 500 MG capsule Take 2 capsules (1,000 mg total) by mouth 2 (two) times daily. 40 capsule 0  . ipratropium (ATROVENT) 0.06 % nasal spray Place 2 sprays into the nose 3  (three) times daily. 15 mL 0     Review of Systems Full ROS  was asked and was negative except for the information on the HPI  Physical Exam Blood pressure 134/70, pulse 90, temperature 98.1 F (36.7 C), temperature source Oral, resp. rate 18, height  (1.676 m), weight 117.9 kg (260 lb), SpO2 96 %. CONSTITUTIONAL: NAD, obese female EYES: Pupils are equal, round, and reactive to light, Sclera are non-icteric. EARS, NOSE, MOUTH AND THROAT: The oropharynx is clear. The oral mucosa is pink and moist. Hearing is intact to voice. LYMPH NODES:  Lymph nodes in the neck are normal. RESPIRATORY:  Lungs are clear. There is normal respiratory effort, with equal breath sounds bilaterally, and without pathologic use of accessory muscles. CARDIOVASCULAR: Heart is regular without murmurs, gallops, or rubs. GI: The abdomen is soft, tender to palpation RLQ, no peritonitis.  GU: Rectal deferred.   MUSCULOSKELETAL: Normal muscle strength and tone. No cyanosis or edema.   SKIN: Turgor is good and there are no pathologic skin lesions or ulcers. NEUROLOGIC: Motor and sensation is grossly normal. Cranial nerves are grossly intact. PSYCH:  Oriented to person, place and time. Affect is normal.    Assessment/Plan 31 year old obese female with abdominal pain not classic for appendicitis. Normal white count and CT scan almost unremarkable other than a little bit of haziness around the appendix. I have discussed with the patient in detail about her disease process and options. I offer her to perform  a diagnostic laparoscopy with appendectomy tonight versus admit her to the hospital observation and serial abdominal exams. As her picture is not 100% accurate for appendicitis I think that observation with serial abdominal exam is a very reasonable option. Patient refers that she does not want to have an operation if the diagnosis is uncertain. Discussed with her that if her pain does not improve by tomorrow and we  will probably recommend doing a diagnostic laparoscopy with appendectomy. Discussed with the patient in detail about the procedure, risks benefits and possible complete locations. For now will admit, placed nothing by mouth and perform serial abdominal exams. Sensitive counseling provided to the patient's family and the case was discussed in detail with Dr. Hayden Rasmussen, MD FACS General Surgeon 02/03/2017, 9:51 PM

## 2017-02-03 NOTE — ED Notes (Signed)
Patient c/o rash to bilateral arms and chest that are itching.  Denies SOB.  MD notified.  Benadryl  ordered.

## 2017-02-03 NOTE — ED Triage Notes (Signed)
Pt reports RLQ pain with nausea and decreased appetite since last night. Pt denies pain with urination or urinary frequency, denies diarrhea, denies any vaginal pain. Pt has IUD, denies menstrual cycle in last 4 years.

## 2017-02-03 NOTE — ED Notes (Signed)
Report to Nicki Guadalaj[place], RN 2C

## 2017-02-03 NOTE — ED Provider Notes (Signed)
[facility] Emergency Department Provider Note  ____________________________________________  Time seen: Approximately 7:41 PM  I have reviewed the triage vital signs and the nursing notes.   HISTORY  Chief Complaint Abdominal Pain    HPI JALEIYAH ALAS is a 31 y.o. female with morbid obesity presenting with right lower quadrant pain. The patient states she was sitting at dinner last night when she developed some sharp pains in the right lower quadrant. Throughout the day today she developed nausea and the pain became worse. She has not had any dysuria, urinary frequency or change in vaginal discharge. See test here is negative.   Past Medical History:  Diagnosis Date  . ADHD     There are no active problems to display for this patient.   Past Surgical History:  Procedure Laterality Date  . TONSILLECTOMY      Current Outpatient Rx  . Order #: [id number] Class: Normal  . Order #: [id number] Class: Normal    Allergies Patient has no known allergies.  No family history on file.  Social History Social History  Substance Use Topics  . Smoking status: Never Smoker  . Smokeless tobacco: Never Used  . Alcohol use Yes    Review of Systems Constitutional: No fever/chills. Eyes: No visual changes. ENT: No sore throat. No congestion or rhinorrhea. Cardiovascular: Denies chest pain. Denies palpitations. Respiratory: Denies shortness of breath.  No cough. Gastrointestinal: Positive RLQ abdominal pain.  Positive nausea, no vomiting.  No diarrhea.  No constipation. Genitourinary: Negative for dysuria. Musculoskeletal: Negative for back pain. Skin: Negative for rash. Neurological: Negative for headaches. No focal numbness, tingling or weakness.   10-point ROS otherwise negative.  ____________________________________________   PHYSICAL EXAM:  VITAL SIGNS: ED Triage Vitals  Enc Vitals Group     BP 02/03/17 1706 134/70     Pulse Rate 02/03/17  1706 90     Resp 02/03/17 1706 18     Temp 02/03/17 1706 98.1 F (36.7 C)     Temp Source 02/03/17 1706 Oral     SpO2 02/03/17 1706 96 %     Weight 02/03/17 1706 260 lb (117.9 kg)     Height 02/03/17 1706  (1.676 m)     Head Circumference --      Peak Flow --      Pain Score 02/03/17 1710 7     Pain Loc --      Pain Edu? --      Excl. in GC? --     Constitutional: Alert and oriented. Uncomfortable appearing but nontoxic. Answers questions appropriately. Eyes: Conjunctivae are normal.  EOMI. No scleral icterus. Head: Atraumatic. Nose: No congestion/rhinnorhea. Mouth/Throat: Mucous membranes are moist.  Neck: No stridor.  Supple.   Cardiovascular: Normal rate, regular rhythm. No murmurs, rubs or gallops.  Respiratory: Normal respiratory effort.  No accessory muscle use or retractions. Lungs CTAB.  No wheezes, rales or ronchi. Gastrointestinal: Obese. Soft, and nondistended. No skin tenderness to palpation in the right lower quadrant. No guarding or rebound.  No peritoneal signs. Genitourinary: Normal-appearing external genitalia without lesions. Vaginal exam with mild to moderate thin white discharge, normal-appearing cervix, normal vaginal wall tissue. Bimanual exam is negative for CMT, L adnexal tenderness to palpation, no palpable masses.  The patient does have significant ttp in the RLQ/R adnexa on my exam. Musculoskeletal: No LE edema.  Neurologic:  A&Ox3.  Speech is clear.  Face and smile are symmetric.  EOMI.  Moves all extremities well. Skin:  Skin  is warm, dry and intact. No rash noted. Psychiatric: Mood and affect are normal. Speech and behavior are normal.  Normal judgement.  ____________________________________________   LABS (all labs ordered are listed, but only abnormal results are displayed)  Labs Reviewed  WET PREP, GENITAL - Abnormal; Notable for the following:       Result Value   WBC, Wet Prep HPF POC FEW (*)    All other components within normal limits   COMPREHENSIVE METABOLIC PANEL - Abnormal; Notable for the following:    CO2 21 (*)    All other components within normal limits  URINALYSIS, COMPLETE (UACMP) WITH MICROSCOPIC - Abnormal; Notable for the following:    Color, Urine YELLOW (*)    APPearance CLOUDY (*)    Hgb urine dipstick SMALL (*)    Ketones, ur 20 (*)    Protein, ur 30 (*)    Leukocytes, UA SMALL (*)    Bacteria, UA MANY (*)    Squamous Epithelial / LPF TOO NUMEROUS TO COUNT (*)    All other components within normal limits  GLUCOSE, CAPILLARY - Abnormal; Notable for the following:    Glucose-Capillary 113 (*)    All other components within normal limits  URINALYSIS, COMPLETE (UACMP) WITH MICROSCOPIC - Abnormal; Notable for the following:    Color, Urine YELLOW (*)    APPearance HAZY (*)    Specific Gravity, Urine 1.032 (*)    Hgb urine dipstick SMALL (*)    Ketones, ur 80 (*)    Bacteria, UA RARE (*)    Squamous Epithelial / LPF 0-5 (*)    All other components within normal limits  CHLAMYDIA/NGC RT PCR ([facility] ONLY)  LIPASE, BLOOD  CBC  POC URINE PREG, ED  POCT PREGNANCY, URINE   ____________________________________________  EKG  Not indicated ____________________________________________  RADIOLOGY  Ct Renal Stone Study  Result Date: 02/03/2017 CLINICAL DATA:  Initial evaluation for acute right lower quadrant pain with decreased appetite. EXAM: CT ABDOMEN AND PELVIS WITHOUT CONTRAST TECHNIQUE: Multidetector CT imaging of the abdomen and pelvis was performed following the standard protocol without IV contrast. COMPARISON:  None. FINDINGS: Lower chest: Visualized lung bases are clear. Hepatobiliary: Liver demonstrates a normal unenhanced appearance. Gallbladder normal. No biliary dilatation. Pancreas: Pancreas within normal limits. Spleen: Spleen within normal limits. Adrenals/Urinary Tract: Adrenal glands are normal. Kidneys equal in size without evidence for nephrolithiasis or hydronephrosis. No  radiopaque calculi seen along the course of either renal collecting system. No hydroureter. Partially distended bladder within normal limits. No layering stones within the bladder lumen Stomach/Bowel: Stomach within normal limits. No evidence for bowel obstruction. Appendix visualized at the base of the cecum. Appendix is within normal limits for caliber without dilatation. There is question of minimal subtle hazy stranding/induration about the base of the appendix (series 5, image 68). Distally, the appendix is unremarkable without acute inflammatory changes. Finding could reflect the sequelae of mild and/or early/developing acute appendicitis. No other acute inflammatory changes seen about the bowels. Vascular/Lymphatic: Intra-abdominal aorta of normal caliber. No pathologically enlarged intra-abdominopelvic lymph nodes. Few mildly prominent mesenteric lymph nodes seen clustered within the right lower quadrant, which may be reactive. Reproductive: IUD in place within the uterus. Uterus otherwise unremarkable. 2.3 cm left ovarian cyst noted, likely a normal physiologic cyst. Ovaries otherwise unremarkable. Other: No free air or fluid. Small fat containing paraumbilical hernia noted. Musculoskeletal: No acute osseous abnormality. No worrisome lytic or blastic osseous lesions. 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 IMPRESSION:  1. Subtle hazy stranding/induration about the base of the appendix. While these changes are very mild at this point, findings raise the possibility for mild and/or early/developing acute appendicitis. Correlation with laboratory values and symptomatology recommended. Additionally, a short interval follow-up scan to evaluate for interval change may be helpful for further evaluation. 2. No other acute intra-abdominal or pelvic process. 3. No nephrolithiasis or obstructive uropathy. Electronically Signed   By: BenjamiRise Mu.   On: 02/03/2017  20:23    ____________________________________________   PROCEDURES  Procedure(s) performed: None  Procedures  Critical Care performed: No ____________________________________________   INITIAL IMPRESSION / ASSESSMENT AND PLAN / ED COURSE  Pertinent labs & imaging results that were available during my care of the patient were reviewed by me and considered in my medical decision making (see chart for details).  31 y.o. F presenting w/ RLQ pain, nausea.  The patient does have a significant amount of tenderness in her right lower quadrant, and we will rule out appendicitis with CT. Renal colic is also possible but less likely. She may have pyelonephritis but has many squamous cells in her urinalysis so we'll do an in and out catheterization for reevaluation of this. The patient denies any change in her vaginal discharge and is not pregnant so pelvic examination is deferred at this time but we will reevaluate her if the remainder of the workup does not reveal a definitive diagnosis.  ----------------------------------------- 8:44 PM on 02/03/2017 -----------------------------------------  At this time, the patient is feeling much better. Her CT scan does show some induration around the base of the appendix and I have talked with the general surgeon on-call, Dr. Everlene Farrier, for further evaluation. We will proceed with doing a pelvic examination to rule out GU pathology as well.  The patient's catheterized urine sample does not show UTI as the cause for this patient's symptoms.  ----------------------------------------- 9:53 PM on 02/03/2017 -----------------------------------------  The patient has no evidence of trichomoniasis, but she'll vaginosis or yeast on her wet prep. Her stuttering chlamydia testing are still pending. The patient is admitted to the surgical team for continued observation. ____________________________________________  FINAL CLINICAL IMPRESSION(S) / ED  DIAGNOSES  Final diagnoses:  Right lower quadrant pain  Acute appendicitis, unspecified acute appendicitis type         NEW MEDICATIONS STARTED DURING THIS VISIT:  New Prescriptions   No medications on file      Rockne Menghini, MD 02/03/17 2154

## 2017-02-04 ENCOUNTER — Observation Stay: Payer: 59 | Admitting: Anesthesiology

## 2017-02-04 ENCOUNTER — Encounter: Payer: Self-pay | Admitting: Anesthesiology

## 2017-02-04 ENCOUNTER — Encounter
Admission: EM | Disposition: A | Discharge status: Home / Self Care | Payer: Self-pay | Source: Home / Self Care | Attending: Emergency Medicine

## 2017-02-04 DIAGNOSIS — K3580 Unspecified acute appendicitis: Secondary | ICD-10-CM

## 2017-02-04 HISTORY — PX: LAPAROSCOPIC APPENDECTOMY: SHX408

## 2017-02-04 LAB — CBC
HEMATOCRIT: 36.6 % (ref 35.0–47.0)
Hemoglobin: 12.7 g/dL (ref 12.0–16.0)
MCH: 32.5 pg (ref 26.0–34.0)
MCHC: 34.7 g/dL (ref 32.0–36.0)
MCV: 93.4 fL (ref 80.0–100.0)
Platelets: 161 10*3/uL (ref 150–440)
RBC: 3.92 MIL/uL (ref 3.80–5.20)
RDW: 13.8 % (ref 11.5–14.5)
WBC: 7 10*3/uL (ref 3.6–11.0)

## 2017-02-04 LAB — BASIC METABOLIC PANEL
Anion gap: 4 — ABNORMAL LOW (ref 5–15)
BUN: 10 mg/dL (ref 6–20)
CHLORIDE: 110 mmol/L (ref 101–111)
CO2: 23 mmol/L (ref 22–32)
Calcium: 7.6 mg/dL — ABNORMAL LOW (ref 8.9–10.3)
Creatinine, Ser: 0.63 mg/dL (ref 0.44–1.00)
GFR calc Af Amer: >60 mL/min (ref >60)
GFR calc non Af Amer: >60 mL/min (ref >60)
GLUCOSE: 85 mg/dL (ref 65–99)
POTASSIUM: 3.4 mmol/L — AB (ref 3.5–5.1)
SODIUM: 137 mmol/L (ref 135–145)

## 2017-02-04 SURGERY — APPENDECTOMY, LAPAROSCOPIC
Anesthesia: General

## 2017-02-04 MED ORDER — LIDOCAINE HCL (CARDIAC) 20 MG/ML IV SOLN
INTRAVENOUS | Status: DC | PRN
  Administered 2017-02-04: 50 mg via INTRAVENOUS

## 2017-02-04 MED ORDER — KETOROLAC TROMETHAMINE 30 MG/ML IJ SOLN
INTRAMUSCULAR | Status: DC | PRN
  Administered 2017-02-04: 30 mg via INTRAVENOUS

## 2017-02-04 MED ORDER — BUPIVACAINE-EPINEPHRINE (PF) 0.25% -1:200000 IJ SOLN
INTRAMUSCULAR | Status: AC
  Filled 2017-02-04: qty 30

## 2017-02-04 MED ORDER — DEXTROSE 5 % IV SOLN
2.0000 g | Freq: Once | INTRAVENOUS | Status: DC
  Filled 2017-02-04: qty 2

## 2017-02-04 MED ORDER — ACETAMINOPHEN 10 MG/ML IV SOLN
INTRAVENOUS | Status: AC
  Filled 2017-02-04: qty 100

## 2017-02-04 MED ORDER — DEXAMETHASONE SODIUM PHOSPHATE 10 MG/ML IJ SOLN
INTRAMUSCULAR | Status: AC
  Filled 2017-02-04: qty 1

## 2017-02-04 MED ORDER — HYDROCODONE-ACETAMINOPHEN 5-325 MG PO TABS
1.0000 | ORAL_TABLET | ORAL | Status: DC | PRN
  Administered 2017-02-04 – 2017-02-05 (×2): 1 via ORAL
  Filled 2017-02-04 (×2): qty 1

## 2017-02-04 MED ORDER — NEOSTIGMINE METHYLSULFATE 10 MG/10ML IV SOLN
INTRAVENOUS | Status: DC | PRN
  Administered 2017-02-04: 5 mg via INTRAVENOUS

## 2017-02-04 MED ORDER — FENTANYL CITRATE (PF) 100 MCG/2ML IJ SOLN
INTRAMUSCULAR | Status: DC | PRN
Start: 2017-02-04 — End: 2017-02-04
  Administered 2017-02-04 (×2): 100 ug via INTRAVENOUS
  Administered 2017-02-04: 50 ug via INTRAVENOUS

## 2017-02-04 MED ORDER — ROCURONIUM BROMIDE 50 MG/5ML IV SOLN
INTRAVENOUS | Status: AC
  Filled 2017-02-04: qty 1

## 2017-02-04 MED ORDER — DEXAMETHASONE SODIUM PHOSPHATE 10 MG/ML IJ SOLN
INTRAMUSCULAR | Status: DC | PRN
  Administered 2017-02-04: 10 mg via INTRAVENOUS

## 2017-02-04 MED ORDER — MIDAZOLAM HCL 2 MG/2ML IJ SOLN
INTRAMUSCULAR | Status: AC
  Filled 2017-02-04: qty 2

## 2017-02-04 MED ORDER — GLYCOPYRROLATE 0.2 MG/ML IJ SOLN
INTRAMUSCULAR | Status: DC | PRN
  Administered 2017-02-04: 0.6 mg via INTRAVENOUS

## 2017-02-04 MED ORDER — AMPHETAMINE-DEXTROAMPHET ER 30 MG PO CP24
30.0000 mg | ORAL_CAPSULE | Freq: Every day | ORAL | Status: DC
  Administered 2017-02-05: 30 mg via ORAL
  Filled 2017-02-04: qty 1

## 2017-02-04 MED ORDER — FENTANYL CITRATE (PF) 100 MCG/2ML IJ SOLN
INTRAMUSCULAR | Status: AC
  Administered 2017-02-04: 25 ug via INTRAVENOUS
  Filled 2017-02-04: qty 2

## 2017-02-04 MED ORDER — PROMETHAZINE HCL 25 MG/ML IJ SOLN: 6.2500 mg | INTRAMUSCULAR | Status: DC | PRN

## 2017-02-04 MED ORDER — CEFOXITIN SODIUM-DEXTROSE 2-2.2 GM-% IV SOLR (PREMIX)
2.0000 g | Freq: Once | INTRAVENOUS | Status: AC
  Administered 2017-02-04: 2 g via INTRAVENOUS
  Filled 2017-02-04: qty 50

## 2017-02-04 MED ORDER — SUCCINYLCHOLINE CHLORIDE 20 MG/ML IJ SOLN
INTRAMUSCULAR | Status: DC | PRN
  Administered 2017-02-04: 100 mg via INTRAVENOUS

## 2017-02-04 MED ORDER — FENTANYL CITRATE (PF) 100 MCG/2ML IJ SOLN
25.0000 ug | INTRAMUSCULAR | Status: DC | PRN
  Administered 2017-02-04 (×4): 25 ug via INTRAVENOUS

## 2017-02-04 MED ORDER — ACETAMINOPHEN 10 MG/ML IV SOLN
INTRAVENOUS | Status: DC | PRN
  Administered 2017-02-04: 1000 mg via INTRAVENOUS

## 2017-02-04 MED ORDER — FENTANYL CITRATE (PF) 250 MCG/5ML IJ SOLN
INTRAMUSCULAR | Status: AC
  Filled 2017-02-04: qty 5

## 2017-02-04 MED ORDER — SEVOFLURANE IN SOLN
RESPIRATORY_TRACT | Status: AC
  Filled 2017-02-04: qty 250

## 2017-02-04 MED ORDER — BUPIVACAINE-EPINEPHRINE (PF) 0.25% -1:200000 IJ SOLN
INTRAMUSCULAR | Status: DC | PRN
  Administered 2017-02-04: 30 mL via PERINEURAL

## 2017-02-04 MED ORDER — MIDAZOLAM HCL 2 MG/2ML IJ SOLN
INTRAMUSCULAR | Status: DC | PRN
  Administered 2017-02-04: 2 mg via INTRAVENOUS

## 2017-02-04 MED ORDER — LIDOCAINE HCL (PF) 2 % IJ SOLN
INTRAMUSCULAR | Status: AC
  Filled 2017-02-04: qty 2

## 2017-02-04 MED ORDER — ONDANSETRON HCL 4 MG/2ML IJ SOLN
INTRAMUSCULAR | Status: AC
  Filled 2017-02-04: qty 2

## 2017-02-04 MED ORDER — PROPOFOL 10 MG/ML IV BOLUS
INTRAVENOUS | Status: AC
  Filled 2017-02-04: qty 20

## 2017-02-04 MED ORDER — NEOSTIGMINE METHYLSULFATE 5 MG/5ML IV SOSY
PREFILLED_SYRINGE | INTRAVENOUS | Status: AC
  Filled 2017-02-04: qty 5

## 2017-02-04 MED ORDER — GLYCOPYRROLATE 0.2 MG/ML IJ SOLN
INTRAMUSCULAR | Status: AC
  Filled 2017-02-04: qty 3

## 2017-02-04 MED ORDER — ONDANSETRON HCL 4 MG/2ML IJ SOLN
INTRAMUSCULAR | Status: DC | PRN
  Administered 2017-02-04: 4 mg via INTRAVENOUS

## 2017-02-04 MED ORDER — AMPHET-DEXTROAMPHET 3-BEAD ER 50 MG PO CP24: 1.0000 | ORAL_CAPSULE | Freq: Every day | ORAL | Status: DC

## 2017-02-04 MED ORDER — CEFOXITIN SODIUM 2 G IV SOLR
INTRAVENOUS | Status: AC
  Filled 2017-02-04: qty 2

## 2017-02-04 MED ORDER — ROCURONIUM BROMIDE 100 MG/10ML IV SOLN
INTRAVENOUS | Status: DC | PRN
  Administered 2017-02-04: 30 mg via INTRAVENOUS
  Administered 2017-02-04: 10 mg via INTRAVENOUS

## 2017-02-04 SURGICAL SUPPLY — 41 items
ADHESIVE MASTISOL STRL (MISCELLANEOUS) ×3 IMPLANT
APPLIER CLIP ROT 10 11.4 M/L (STAPLE) ×3
BLADE SURG SZ11 CARB STEEL (BLADE) ×3 IMPLANT
CANISTER SUCT 3000ML (MISCELLANEOUS) ×3 IMPLANT
CATH TRAY 16F METER LATEX (MISCELLANEOUS) ×3 IMPLANT
CHLORAPREP W/TINT 26ML (MISCELLANEOUS) ×3 IMPLANT
CLIP APPLIE ROT 10 11.4 M/L (STAPLE) ×1 IMPLANT
CLOSURE WOUND 1/2 X4 (GAUZE/BANDAGES/DRESSINGS) ×1
CUTTER FLEX LINEAR 45M (STAPLE) ×3 IMPLANT
DEVICE TROCAR PUNCTURE CLOSURE (ENDOMECHANICALS) ×3 IMPLANT
ELECT REM PT RETURN 9FT ADLT (ELECTROSURGICAL)
ELECTRODE REM PT RTRN 9FT ADLT (ELECTROSURGICAL) IMPLANT
ENDOPOUCH RETRIEVER 10 (MISCELLANEOUS) ×3 IMPLANT
GAUZE SPONGE NON-WVN 2X2 STRL (MISCELLANEOUS) ×3 IMPLANT
GLOVE BIO SURGEON STRL SZ8 (GLOVE) ×3 IMPLANT
GOWN STRL REUS W/ TWL LRG LVL3 (GOWN DISPOSABLE) ×2 IMPLANT
GOWN STRL REUS W/TWL LRG LVL3 (GOWN DISPOSABLE) ×4
IRRIGATION STRYKERFLOW (MISCELLANEOUS) ×1 IMPLANT
IRRIGATOR STRYKERFLOW (MISCELLANEOUS) ×3
KIT RM TURNOVER STRD PROC AR (KITS) ×3 IMPLANT
LABEL OR SOLS (LABEL) ×3 IMPLANT
NDL SAFETY 22GX1.5 (NEEDLE) ×3 IMPLANT
NEEDLE VERESS 14GA 120MM (NEEDLE) ×3 IMPLANT
NS IRRIG 500ML POUR BTL (IV SOLUTION) ×3 IMPLANT
PACK LAP CHOLECYSTECTOMY (MISCELLANEOUS) ×3 IMPLANT
RELOAD 45 VASCULAR/THIN (ENDOMECHANICALS) ×3 IMPLANT
RELOAD STAPLE TA45 3.5 REG BLU (ENDOMECHANICALS) ×3 IMPLANT
SCISSORS METZENBAUM CVD 33 (INSTRUMENTS) IMPLANT
SLEEVE ENDOPATH XCEL 5M (ENDOMECHANICALS) ×3 IMPLANT
SOL .9 NS 3000ML IRR  AL (IV SOLUTION) ×2
SOL .9 NS 3000ML IRR UROMATIC (IV SOLUTION) ×1 IMPLANT
SPONGE LAP 18X18 5 PK (GAUZE/BANDAGES/DRESSINGS) ×3 IMPLANT
SPONGE VERSALON 2X2 STRL (MISCELLANEOUS) ×6
STRIP CLOSURE SKIN 1/2X4 (GAUZE/BANDAGES/DRESSINGS) ×2 IMPLANT
SUT MNCRL 4-0 (SUTURE) ×2
SUT MNCRL 4-0 27XMFL (SUTURE) ×1
SUT VICRYL 0 TIES 12 18 (SUTURE) ×3 IMPLANT
SUTURE MNCRL 4-0 27XMF (SUTURE) ×1 IMPLANT
TROCAR XCEL 12X100 BLDLESS (ENDOMECHANICALS) ×3 IMPLANT
TROCAR XCEL NON-BLD 5MMX100MML (ENDOMECHANICALS) ×3 IMPLANT
TUBING INSUFFLATOR HI FLOW (MISCELLANEOUS) ×3 IMPLANT

## 2017-02-04 NOTE — Progress Notes (Signed)
CH responded to an OR for AD. CH provided Advanced Directive and education. Pt filled the AD, CH gathered 2 witnesses and Dance movement psychotherapist and completed the AD. CH gave Pt the original and a copy, and placed a copy in the Pt file.    02/04/17 1400  Clinical Encounter Type  Visited With Patient;Family  Visit Type Other (Comment)  Referral From Nurse  Consult/Referral To Chaplain  Spiritual Encounters  Spiritual Needs Literature;Brochure

## 2017-02-04 NOTE — Anesthesia Post-op Follow-up Note (Cosign Needed)
Anesthesia QCDR form completed.        

## 2017-02-04 NOTE — Progress Notes (Signed)
Visited the patient this afternoon. She has just been medicated with dilute lighted but requested the dilated due to increasing pain. She has no nausea or vomiting. She thinks that she is worse before that allotted than she was last night.  Vital signs reviewed Morbidly obese female patient abdomen is tender in the right lower quadrant at McBurney's point with some guarding but no rebound or percussion tenderness and a negative Rovsing sign  Patient shows progression of symptoms as well as exam. I would recommend laparoscopy as she has clearly required more pain medication this afternoon. The options of observation reviewed the potential for an open procedure were reviewed and the risks of bleeding infection recurrence negative laparoscopy were all discussed she understood and agreed to proceed

## 2017-02-04 NOTE — Anesthesia Procedure Notes (Signed)
Procedure Name: Intubation Date/Time: 02/04/2017 4:14 PM Performed by: Omer Jack Pre-anesthesia Checklist: Patient identified, Patient being monitored, Timeout performed, Emergency Drugs available and Suction available Patient Re-evaluated:Patient Re-evaluated prior to inductionOxygen Delivery Method: Circle system utilized Preoxygenation: Pre-oxygenation with 100% oxygen Intubation Type: IV induction and Cricoid Pressure applied Ventilation: Mask ventilation without difficulty Laryngoscope Size: 3 and McGraph Grade View: Grade I Tube type: Oral Tube size: 7.0 mm Number of attempts: 1 Airway Equipment and Method: Stylet Placement Confirmation: ETT inserted through vocal cords under direct vision,  positive ETCO2 and breath sounds checked- equal and bilateral Secured at: 21 cm Tube secured with: Tape Dental Injury: Teeth and Oropharynx as per pre-operative assessment

## 2017-02-04 NOTE — Op Note (Signed)
laparascopic appendectomy   Christy Mcdonald Date of operation:  02/04/2017  Indications: The patient presented with a history of  abdominal pain. Workup has revealed findings consistent with acute appendicitis.  Pre-operative Diagnosis: Appendicitis  Post-operative Diagnosis: Acute appendicitis  Surgeon: Adah Salvage. Excell Seltzer, MD, FACS  Anesthesia: General with endotracheal tube  Procedure Details  The patient was seen again in the preop area. The options of surgery versus observation were reviewed with the patient and/or family. The risks of bleeding, infection, recurrence of symptoms, negative laparoscopy, potential for an open procedure, bowel injury, abscess or infection, were all reviewed as well. The patient was taken to Operating Room, identified as Christy Mcdonald and the procedure verified as laparoscopic appendectomy. A Time Out was held and the above information confirmed.  The patient was placed in the supine position and general anesthesia was induced.  Antibiotic prophylaxis was administered and VT E prophylaxis was in place. A Foley catheter was placed by the nursing staff.   The abdomen was prepped and draped in a sterile fashion. An infraumbilical incision was made. A Veress needle was placed and pneumoperitoneum was obtained. A 5 mm trocar port was placed without difficulty and the abdominal cavity was explored.  Under direct vision a 5 mm suprapubic port was placed and a 13 mm left lateral port was placed all under direct vision.  The appendix was identified and found to be acutely inflamed suggestive of early appendicitis. It was nonruptured  The appendix was carefully dissected. The base of the appendix was dissected out and divided with a standard load Endo GIA. The mesoappendix was divided with a vascular load Endo GIA. There was bleeding from the appendiceal stump as well as from the mesial appendix therefore clips were utilized to reinforce the staple line. The appendix  was passed out through the left lateral port site with the aid of an Endo Catch bag. The right lower quadrant and pelvis was then irrigated with copious amounts of normal saline which was aspirated. Inspection  failed to identify any additional bleeding and there were no signs of bowel injury. Therefore the left lateral port site was closed under direct vision utilizing an Endo Close technique with 0 Vicryl interrupted sutures, all under direct vision.   Again the right lower quadrant was inspected there was no sign of bleeding or bowel injury therefore pneumoperitoneum was released, all ports were removed and the skin incisions were approximated with subcuticular 4-0 Monocryl. Steri-Strips and Mastisol and sterile dressings were placed.  The patient tolerated the procedure well, there were no complications. The sponge lap and needle count were correct at the end of the procedure.  The patient was taken to the recovery room in stable condition to be admitted for continued care.  Findings: Acute appendicitis nonruptured  Estimated Blood Loss: 10 cc                  Specimens: appendix         Complications:  None                  Caydee Talkington E. Excell Seltzer MD, FACS

## 2017-02-04 NOTE — Anesthesia Postprocedure Evaluation (Signed)
Anesthesia Post Note  Patient: Christy Mcdonald  Procedure(s) Performed: Procedure(s) (LRB): APPENDECTOMY LAPAROSCOPIC (N/A)  Patient location during evaluation: PACU Anesthesia Type: General Level of consciousness: awake and alert Pain management: pain level controlled Vital Signs Assessment: post-procedure vital signs reviewed and stable Respiratory status: spontaneous breathing, nonlabored ventilation, respiratory function stable and patient connected to nasal cannula oxygen Cardiovascular status: blood pressure returned to baseline and stable Postop Assessment: no signs of nausea or vomiting Anesthetic complications: no     Last Vitals:  Vitals:   02/04/17 1756 02/04/17 1808  BP:  (!) 125/58  Pulse:  82  Resp:  17  Temp: 37 C 36.7 C    Last Pain:  Vitals:   02/04/17 1822  TempSrc:   PainSc: 8                  Lenard Simmer

## 2017-02-04 NOTE — Progress Notes (Signed)
Patient's history is reviewed both with the chart with Dr. Everlene Farrier and with the patient. She describes pain since Wednesday but states that she is better today but has had a single dose of IV narcotics last night. Her nausea is completely gone that she was having so severely yesterday.  Vital signs are stable she is afebrile Abdomen is obese with a piercing in the infraumbilical area no erythema negative Rovsing sign but tender in the right lower quadrant with no guarding or rebound or percussion tenderness calves are nontender  White blood cell count is improved  We'll reexamine. The patient is tender at McBurney's point without peritoneal signs but this could be early appendicitis. We'll reexamine and consider laparoscopy if she does not improve.

## 2017-02-04 NOTE — Anesthesia Preprocedure Evaluation (Addendum)
Anesthesia Evaluation  Patient identified by MRN, date of birth, ID band Patient awake    Reviewed: Allergy & Precautions, H&P , NPO status , Patient's Chart, lab work & pertinent test results, reviewed documented beta blocker date and time   History of Anesthesia Complications Negative for: history of anesthetic complications  Airway Mallampati: III  TM Distance: >3 FB Neck ROM: full    Dental  (+) Teeth Intact   Pulmonary neg pulmonary ROS,           Cardiovascular Exercise Tolerance: Good negative cardio ROS Normal cardiovascular exam     Neuro/Psych PSYCHIATRIC DISORDERS (ADHD) negative neurological ROS     GI/Hepatic Neg liver ROS, GERD  ,  Endo/Other  neg diabetesMorbid obesity  Renal/GU negative Renal ROS  negative genitourinary   Musculoskeletal   Abdominal   Peds  Hematology negative hematology ROS (+)   Anesthesia Other Findings Past Medical History: No date: ADHD   Reproductive/Obstetrics negative OB ROS                            Anesthesia Physical Anesthesia Plan  ASA: III  Anesthesia Plan: General ETT, Cricoid Pressure and Rapid Sequence   Post-op Pain Management:    Induction:   Airway Management Planned:   Additional Equipment:   Intra-op Plan:   Post-operative Plan:   Informed Consent: I have reviewed the patients History and Physical, chart, labs and discussed the procedure including the risks, benefits and alternatives for the proposed anesthesia with the patient or authorized representative who has indicated his/her understanding and acceptance.   Dental Advisory Given  Plan Discussed with: Anesthesiologist, CRNA and Surgeon  Anesthesia Plan Comments:        Anesthesia Quick Evaluation

## 2017-02-04 NOTE — Transfer of Care (Signed)
Immediate Anesthesia Transfer of Care Note  Patient: Christy Mcdonald  Procedure(s) Performed: Procedure(s): APPENDECTOMY LAPAROSCOPIC (N/A)  Patient Location: PACU  Anesthesia Type:General  Level of Consciousness: awake, alert  and oriented  Airway & Oxygen Therapy: Patient Spontanous Breathing and Patient connected to face mask oxygen  Post-op Assessment: Report given to RN and Post -op Vital signs reviewed and stable  Post vital signs: Reviewed and stable  Last Vitals:  Vitals:   02/04/17 1246 02/04/17 1708  BP: 109/67 (!) 137/99  Pulse: 71 87  Resp: 17   Temp: 36.9 C 36.3 C    Last Pain:  Vitals:   02/04/17 1708  TempSrc:   PainSc: 3          Complications: No apparent anesthesia complications

## 2017-02-05 LAB — HIV ANTIBODY (ROUTINE TESTING W REFLEX): HIV SCREEN 4TH GENERATION: NONREACTIVE

## 2017-02-05 MED ORDER — AMPHET-DEXTROAMPHET 3-BEAD ER 50 MG PO CP24: 1.0000 | ORAL_CAPSULE | ORAL | Status: DC

## 2017-02-05 MED ORDER — HYDROCODONE-ACETAMINOPHEN 5-325 MG PO TABS: 1.0000 | ORAL_TABLET | ORAL | 0 refills | Status: AC | PRN

## 2017-02-05 NOTE — Discharge Summary (Signed)
Physician Discharge Summary  Patient ID: Christy Mcdonald MRN: [id number] DOB/AGE: 25-Mar-1986 31 y.o.  Admit date: 02/03/2017 Discharge date: 02/05/2017   Discharge Diagnoses:  Active Problems:   Abdominal pain   Acute appendicitis   Procedures:Laparoscopic appendectomy  Hospital Course: This patient status post laparoscopic appendectomy. She was admitted to the hospital with signs of right lower quadrant pain but with a normal white count and a questionable CT scan she was observed. During that observation. She worsened and her right lower quadrant pain became unrelenting. She was taken the operating room where early appendicitis was confirmed pathology is currently pending. She is tolerating a diet and will be discharged in stable condition to follow-up in our office in 10 days with instructions to shower.  Consults: None  Disposition: 01-Home or Self Care    Follow-up Information    Dionne Milo, MD Follow up in 10 day(s).   Specialty:  Surgery Contact information: [address] [place] [address] [phone]           Lattie Haw, MD, FACS

## 2017-02-05 NOTE — Progress Notes (Signed)
1 Day Post-Op  Subjective: Patient describes left lower quadrant incisional pain and states that the right lower quadrant pain is gone at this point she is hungry and has been tolerating a clear liquid diet.  Objective: Vital signs in last 24 hours: Temp:  [97.3 F (36.3 C)-98.6 F (37 C)] 97.6 F (36.4 C) (04/15 0515) Pulse Rate:  [71-88] 75 (04/15 0515) Resp:  [17-23] 20 (04/15 0515) BP: (109-137)/(58-99) 117/80 (04/15 0515) SpO2:  [96 %-100 %] 98 % (04/15 0515) Last BM Date: 02/03/17  Intake/Output from previous day: 04/14 0701 - 04/15 0700 In: 4180 [I.V.:4180] Out: 1455 [Urine:1450; Blood:5] Intake/Output this shift: No intake/output data recorded.  Physical exam:  Morbidly obese female patient wounds are dressed and clean nontender abdomen tender calves  Lab Results: CBC   Recent Labs  02/03/17 1715 02/04/17 0321  WBC 10.3 7.0  HGB 14.4 12.7  HCT 41.2 36.6  PLT 216 161   BMET  Recent Labs  02/03/17 1715 02/04/17 0321  NA 137 137  K 3.6 3.4*  CL 105 110  CO2 21* 23  GLUCOSE 94 85  BUN 14 10  CREATININE 0.68 0.63  CALCIUM 9.0 7.6*   PT/INR No results for input(s): LABPROT, INR in the last 72 hours. ABG No results for input(s): PHART, HCO3 in the last 72 hours.  Invalid input(s): PCO2, PO2  Studies/Results: Ct Renal Stone Study  Result Date: 02/03/2017 CLINICAL DATA:  Initial evaluation for acute right lower quadrant pain with decreased appetite. EXAM: CT ABDOMEN AND PELVIS WITHOUT CONTRAST TECHNIQUE: Multidetector CT imaging of the abdomen and pelvis was performed following the standard protocol without IV contrast. COMPARISON:  None. FINDINGS: Lower chest: Visualized lung bases are clear. Hepatobiliary: Liver demonstrates a normal unenhanced appearance. Gallbladder normal. No biliary dilatation. Pancreas: Pancreas within normal limits. Spleen: Spleen within normal limits. Adrenals/Urinary Tract: Adrenal glands are normal. Kidneys equal in size  without evidence for nephrolithiasis or hydronephrosis. No radiopaque calculi seen along the course of either renal collecting system. No hydroureter. Partially distended bladder within normal limits. No layering stones within the bladder lumen Stomach/Bowel: Stomach within normal limits. No evidence for bowel obstruction. Appendix visualized at the base of the cecum. Appendix is within normal limits for caliber without dilatation. There is question of minimal subtle hazy stranding/induration about the base of the appendix (series 5, image 68). Distally, the appendix is unremarkable without acute inflammatory changes. Finding could reflect the sequelae of mild and/or early/developing acute appendicitis. No other acute inflammatory changes seen about the bowels. Vascular/Lymphatic: Intra-abdominal aorta of normal caliber. No pathologically enlarged intra-abdominopelvic lymph nodes. Few mildly prominent mesenteric lymph nodes seen clustered within the right lower quadrant, which may be reactive. Reproductive: IUD in place within the uterus. Uterus otherwise unremarkable. 2.3 cm left ovarian cyst noted, likely a normal physiologic cyst. Ovaries otherwise unremarkable. Other: No free air or fluid. Small fat containing paraumbilical hernia noted. Musculoskeletal: No acute osseous abnormality. No worrisome lytic or blastic osseous lesions. 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 IMPRESSION: 1. Subtle hazy stranding/induration about the base of the appendix. While these changes are very mild at this point, findings raise the possibility for mild and/or early/developing acute appendicitis. Correlation with laboratory values and symptomatology recommended. Additionally, a short interval follow-up scan to evaluate for interval change may be helpful for further evaluation. 2. No other acute intra-abdominal or pelvic process. 3. No nephrolithiasis or obstructive uropathy. Electronically  Signed   By: Janell Quiet.D.  On: 02/03/2017 20:23    Anti-infectives: Anti-infectives    Start     Dose/Rate Route Frequency Ordered Stop   02/04/17 1530  cefOXItin (MEFOXIN) 2 g in dextrose 50 mL IVPB (premix)     2 g 100 mL/hr over 30 Minutes Intravenous  Once 02/04/17 1519 02/04/17 1624   02/04/17 1515  cefOXitin (MEFOXIN) 2 g in dextrose 5 % 50 mL IVPB  Status:  Discontinued     2 g 100 mL/hr over 30 Minutes Intravenous  Once 02/04/17 1509 02/04/17 1519      Assessment/Plan: s/p Procedure(s): APPENDECTOMY LAPAROSCOPIC   Patient doing well status post laparoscopic appendectomy for acute appendicitis the appendicitis was deemed to be early on visual inspection but clearly her pain is gone at this point with the exception of now incisional pain. We'll advance diet and discharged today to follow-up in 10 days  Lattie Haw, MD, FACS  02/05/2017

## 2017-02-05 NOTE — Progress Notes (Signed)
Pt to be discharged per Md order. IV removed. Instructions reviewed with pt and family. All questions answered. Will discharge in wheelchair.

## 2017-02-05 NOTE — Discharge Instructions (Signed)
Remove dressing in 24 hours. °May shower in 24 hours. °Leave paper strips in place. °Resume all home medications. °Follow-up with Dr. Amerie Beaumont in 10 days. °

## 2017-02-06 ENCOUNTER — Encounter: Payer: Self-pay | Admitting: Surgery

## 2017-02-07 ENCOUNTER — Telehealth: Payer: Self-pay

## 2017-02-07 LAB — SURGICAL PATHOLOGY

## 2017-02-07 NOTE — Telephone Encounter (Signed)
Disability paperwork just came in for this patient. I spoke with her yesterday when making her appointment. She is aware of the $25 co pay and will pay it at the time of her visit on 02/15/17

## 2017-02-09 NOTE — Telephone Encounter (Signed)
Patient's disability form was filled out. Paperwork left at the front desk to be faxed.

## 2017-02-09 NOTE — Telephone Encounter (Signed)
I have received the disability form and will fax it as soon as the payment is collected.

## 2017-02-12 ENCOUNTER — Emergency Department
Admission: EM | Admit: 2017-02-12 | Discharge: 2017-02-12 | Disposition: A | Discharge status: Home / Self Care | Payer: 59 | Attending: Student in an Organized Health Care Education/Training Program | Admitting: Student in an Organized Health Care Education/Training Program

## 2017-02-12 ENCOUNTER — Emergency Department: Payer: 59

## 2017-02-12 ENCOUNTER — Encounter: Payer: Self-pay | Admitting: Emergency Medicine

## 2017-02-12 DIAGNOSIS — F909 Attention-deficit hyperactivity disorder, unspecified type: Secondary | ICD-10-CM | POA: Diagnosis not present

## 2017-02-12 DIAGNOSIS — R1032 Left lower quadrant pain: Secondary | ICD-10-CM | POA: Diagnosis not present

## 2017-02-12 DIAGNOSIS — G8918 Other acute postprocedural pain: Secondary | ICD-10-CM

## 2017-02-12 LAB — COMPREHENSIVE METABOLIC PANEL
ALBUMIN: 4 g/dL (ref 3.5–5.0)
ALT: 39 U/L (ref 14–54)
ANION GAP: 8 (ref 5–15)
AST: 32 U/L (ref 15–41)
Alkaline Phosphatase: 92 U/L (ref 38–126)
BUN: 13 mg/dL (ref 6–20)
CHLORIDE: 108 mmol/L (ref 101–111)
CO2: 21 mmol/L — AB (ref 22–32)
Calcium: 8.9 mg/dL (ref 8.9–10.3)
Creatinine, Ser: 0.75 mg/dL (ref 0.44–1.00)
GFR calc Af Amer: >60 mL/min (ref >60)
GFR calc non Af Amer: >60 mL/min (ref >60)
GLUCOSE: 112 mg/dL — AB (ref 65–99)
POTASSIUM: 4 mmol/L (ref 3.5–5.1)
SODIUM: 137 mmol/L (ref 135–145)
Total Bilirubin: 0.5 mg/dL (ref 0.3–1.2)
Total Protein: 7.7 g/dL (ref 6.5–8.1)

## 2017-02-12 LAB — CBC
HEMATOCRIT: 42.4 % (ref 35.0–47.0)
HEMOGLOBIN: 14.3 g/dL (ref 12.0–16.0)
MCH: 31.4 pg (ref 26.0–34.0)
MCHC: 33.8 g/dL (ref 32.0–36.0)
MCV: 92.7 fL (ref 80.0–100.0)
Platelets: 245 10*3/uL (ref 150–440)
RBC: 4.57 MIL/uL (ref 3.80–5.20)
RDW: 13.9 % (ref 11.5–14.5)
WBC: 8.3 10*3/uL (ref 3.6–11.0)

## 2017-02-12 MED ORDER — POLYETHYLENE GLYCOL 3350 17 G PO PACK: 17.0000 g | PACK | Freq: Every day | ORAL | 0 refills | Status: DC

## 2017-02-12 MED ORDER — ONDANSETRON HCL 4 MG/2ML IJ SOLN
4.0000 mg | Freq: Once | INTRAMUSCULAR | Status: AC
  Administered 2017-02-12: 4 mg via INTRAVENOUS

## 2017-02-12 MED ORDER — HYDROCODONE-ACETAMINOPHEN 5-325 MG PO TABS: 1.0000 | ORAL_TABLET | ORAL | 0 refills | Status: DC | PRN

## 2017-02-12 MED ORDER — SODIUM CHLORIDE 0.9 % IV BOLUS (SEPSIS)
1000.0000 mL | Freq: Once | INTRAVENOUS | Status: AC
  Administered 2017-02-12: 1000 mL via INTRAVENOUS

## 2017-02-12 MED ORDER — IOPAMIDOL (ISOVUE-300) INJECTION 61%
125.0000 mL | Freq: Once | INTRAVENOUS | Status: AC | PRN
  Administered 2017-02-12: 125 mL via INTRAVENOUS
  Filled 2017-02-12: qty 150

## 2017-02-12 MED ORDER — ONDANSETRON HCL 4 MG/2ML IJ SOLN
INTRAMUSCULAR | Status: AC
  Administered 2017-02-12: 4 mg via INTRAVENOUS
  Filled 2017-02-12: qty 2

## 2017-02-12 MED ORDER — MORPHINE SULFATE (PF) 4 MG/ML IV SOLN
INTRAVENOUS | Status: AC
  Administered 2017-02-12: 4 mg via INTRAVENOUS
  Filled 2017-02-12: qty 1

## 2017-02-12 MED ORDER — MORPHINE SULFATE (PF) 4 MG/ML IV SOLN
4.0000 mg | INTRAVENOUS | Status: DC | PRN
  Administered 2017-02-12: 4 mg via INTRAVENOUS

## 2017-02-12 NOTE — ED Provider Notes (Signed)
[facility] Emergency Department Provider Note    First MD Initiated Contact with Patient 02/12/17 1837     (approximate)  I have reviewed the triage vital signs and the nursing notes.   HISTORY  Chief Complaint Post-op Problem    HPI Christy Mcdonald is a 31 y.o. female who presents roughly 1 week status post laparoscopic appendectomy performed here. Presents with chief complaint of pain and redness around the left lower quadrant port site. No measured fevers. No nausea or vomiting. States the pain is severe without any radiation. It is still moving her bowels. Tolerating oral hydration. No chills. Denies any dysuria or flank pain.   Past Medical History:  Diagnosis Date  . ADHD    History reviewed. No pertinent family history. Past Surgical History:  Procedure Laterality Date  . ABDOMINAL SURGERY    . APPENDECTOMY    . LAPAROSCOPIC APPENDECTOMY N/A 02/04/2017   Procedure: APPENDECTOMY LAPAROSCOPIC;  Surgeon: Lattie Haw, MD;  Location: [facility] ORS;  Service: General;  Laterality: N/A;  . TONSILLECTOMY     Patient Active Problem List   Diagnosis Date Noted  . Acute appendicitis   . Abdominal pain 02/03/2017  . Right lower quadrant pain       Prior to Admission medications   Medication Sig Start Date End Date Taking? Authorizing Provider  amphetamine-dextroamphetamine (ADDERALL XR) 30 MG 24 hr capsule Take 1 capsule by mouth daily. 01/25/17   Historical Provider, MD  HYDROcodone-acetaminophen (NORCO) 5-325 MG tablet Take 1 tablet by mouth every 4 (four) hours as needed for moderate pain. 02/12/17   Willy Eddy, MD  HYDROcodone-acetaminophen (NORCO/VICODIN) 5-325 MG tablet Take 1 tablet by mouth every 4 (four) hours as needed for moderate pain. 02/05/17   Lattie Haw, MD  MYDAYIS 50 MG CP24 Take 1 capsule by mouth daily. 01/28/17   Historical Provider, MD  polyethylene glycol (MIRALAX / GLYCOLAX) packet Take 17 g by mouth daily. Mix one  tablespoon with 8oz of your favorite juice or water every day until you are having soft formed stools. Then start taking once daily if you didn't have a stool the day before. 02/12/17   Willy Eddy, MD    Allergies Patient has no known allergies.    Social History Social History  Substance Use Topics  . Smoking status: Never Smoker  . Smokeless tobacco: Never Used  . Alcohol use Yes    Review of Systems Patient denies headaches, rhinorrhea, blurry vision, numbness, shortness of breath, chest pain, edema, cough, abdominal pain, nausea, vomiting, diarrhea, dysuria, fevers, rashes or hallucinations unless otherwise stated above in HPI. ____________________________________________   PHYSICAL EXAM:  VITAL SIGNS: Vitals:   02/12/17 1650 02/12/17 2023  BP: 124/77 114/76  Pulse: 77 77  Resp: 18 16  Temp: 98.6 F (37 C)     Constitutional: Alert and oriented. Well appearing and in no acute distress. Eyes: Conjunctivae are normal. PERRL. EOMI. Head: Atraumatic. Nose: No congestion/rhinnorhea. Mouth/Throat: Mucous membranes are moist.  Oropharynx non-erythematous. Neck: No stridor. Painless ROM. No cervical spine tenderness to palpation Hematological/Lymphatic/Immunilogical: No cervical lymphadenopathy. Cardiovascular: Normal rate, regular rhythm. Grossly normal heart sounds.  Good peripheral circulation. Respiratory: Normal respiratory effort.  No retractions. Lungs CTAB. Gastrointestinal: Soft and non-tender. LLQ port site appears mildly erythematous and mildly ttp.  No fluctuance or mass.  Periumbilical and RLQ site appear c/d/i   No distention. No abdominal bruits. No CVA tenderness. Musculoskeletal: No lower extremity tenderness nor edema.  No joint  effusions. Neurologic:  Normal speech and language. No gross focal neurologic deficits are appreciated. No gait instability. Skin:  Skin is warm, dry and intact. No rash noted. Psychiatric: Mood and affect are normal. Speech  and behavior are normal.  ____________________________________________   LABS (all labs ordered are listed, but only abnormal results are displayed)  Results for orders placed or performed during the hospital encounter of 02/12/17 (from the past 24 hour(s))  Comprehensive metabolic panel     Status: Abnormal   Collection Time: 02/12/17  5:03 PM  Result Value Ref Range   Sodium 137 135 - 145 mmol/L   Potassium 4.0 3.5 - 5.1 mmol/L   Chloride 108 101 - 111 mmol/L   CO2 21 (L) 22 - 32 mmol/L   Glucose, Bld 112 (H) 65 - 99 mg/dL   BUN 13 6 - 20 mg/dL   Creatinine, Ser 2.84 0.44 - 1.00 mg/dL   Calcium 8.9 8.9 - 13.2 mg/dL   Total Protein 7.7 6.5 - 8.1 g/dL   Albumin 4.0 3.5 - 5.0 g/dL   AST 32 15 - 41 U/L   ALT 39 14 - 54 U/L   Alkaline Phosphatase 92 38 - 126 U/L   Total Bilirubin 0.5 0.3 - 1.2 mg/dL   GFR calc non Af Amer >60 >60 mL/min   GFR calc Af Amer >60 >60 mL/min   Anion gap 8 5 - 15  CBC     Status: None   Collection Time: 02/12/17  5:03 PM  Result Value Ref Range   WBC 8.3 3.6 - 11.0 K/uL   RBC 4.57 3.80 - 5.20 MIL/uL   Hemoglobin 14.3 12.0 - 16.0 g/dL   HCT 44.0 10.2 - 72.5 %   MCV 92.7 80.0 - 100.0 fL   MCH 31.4 26.0 - 34.0 pg   MCHC 33.8 32.0 - 36.0 g/dL   RDW 36.6 44.0 - 34.7 %   Platelets 245 150 - 440 K/uL   ____________________________________________ ____________________________________________  RADIOLOGY  I personally reviewed all radiographic images ordered to evaluate for the above acute complaints and reviewed radiology reports and findings.  These findings were personally discussed with the patient.  Please see medical record for radiology report.  ____________________________________________   PROCEDURES  Procedure(s) performed:  Procedures    Critical Care performed: no ____________________________________________   INITIAL IMPRESSION / ASSESSMENT AND PLAN / ED COURSE  Pertinent labs & imaging results that were available during my  care of the patient were reviewed by me and considered in my medical decision making (see chart for details).  DDX: abscess, seroma, cellulitis, post op pain  Christy Mcdonald is a 31 y.o. who presents to the ED with symptoms described above. Blood work is reassuring but given the area of erythema and mild tenderness to palpation 1 week in the postop setting will order CT imaging to evaluate for evidence of abscess seroma or significant cellulitis as her exam is limited by her obesity.  Clinical Course as of Feb 13 2147  Sun Feb 12, 2017  2005 Patient's abdominal exam remains soft and benign. CT imaging red with area of inflammation surrounding left port. She has no white count and no fever do suspect some component of inflammatory process but do not feel that antibiotics clinically indicated at this time. Patient has follow-up in clinic in 3 days. Will provide additional pain management area and Dr. Earlene Plater of general surgery has evaluated the patient at bedside and agrees with plan.  Have discussed with  the patient and available family all diagnostics and treatments performed thus far and all questions were answered to the best of my ability. The patient demonstrates understanding and agreement with plan.   [PR]    Clinical Course User Index [PR] Willy Eddy, MD     ____________________________________________   FINAL CLINICAL IMPRESSION(S) / ED DIAGNOSES  Final diagnoses:  Post-operative pain  Abdominal pain, LLQ      NEW MEDICATIONS STARTED DURING THIS VISIT:  Discharge Medication List as of 02/12/2017  8:08 PM    START taking these medications   Details  !! HYDROcodone-acetaminophen (NORCO) 5-325 MG tablet Take 1 tablet by mouth every 4 (four) hours as needed for moderate pain., Starting Sun 02/12/2017, Print    polyethylene glycol (MIRALAX / GLYCOLAX) packet Take 17 g by mouth daily. Mix one tablespoon with 8oz of your favorite juice or water every day until you are having  soft formed stools. Then start taking once daily if you didn't have a stool the day before., Starting Sun 02/12/2017, Print     !! - Potential duplicate medications found. Please discuss with provider.       Note:  This document was prepared using Dragon voice recognition software and may include unintentional dictation errors.    Willy Eddy, MD 02/12/17 2151

## 2017-02-12 NOTE — ED Notes (Signed)
FIRST NURSE NOTE:  Left abdominal pain at incision site from surgery last Saturday. Pt has steri strip in place.

## 2017-02-12 NOTE — Discharge Instructions (Signed)

## 2017-02-12 NOTE — ED Triage Notes (Signed)
Pt presents to ED c/o pain to LLQ incision site. Pt is s/p laparoscopic appendectomy 02/04/17 at [facility]. Redness noted to LLQ incision site compared to 2 other sites, and are appears very tender to touch. Has taken ibuprofen and vicodin with minimal relief.

## 2017-02-12 NOTE — Consult Note (Signed)
SURGICAL CONSULTATION NOTE (cpt: 99244)  HISTORY OF PRESENT ILLNESS (HPI):  31 y.o. female presented with Left lower quadrant peri-incisional abdominal pain that began following laparoscopic appendectomy 8 days ago (02/04/2017). Patient reports the pain has not worsened, denies any redness or drainage from the wound, and otherwise reports normal amounts of flatus and BM with no fever/chills, N/V, CP, or SOB. She states she was just worried whether the Left lower quadrant pain she's been feeling is normal after surgery. CT has already been performed.  Surgery is consulted by ED physician Dr. Merilynn Finland in this context for evaluation of Left lower quadrant post-surgical incision/wound.  PAST MEDICAL HISTORY (PMH):  Past Medical History:  Diagnosis Date  . ADHD      PAST SURGICAL HISTORY (PSH):  Past Surgical History:  Procedure Laterality Date  . ABDOMINAL SURGERY    . APPENDECTOMY    . LAPAROSCOPIC APPENDECTOMY N/A 02/04/2017   Procedure: APPENDECTOMY LAPAROSCOPIC;  Surgeon: Lattie Haw, MD;  Location: [facility] ORS;  Service: General;  Laterality: N/A;  . TONSILLECTOMY       MEDICATIONS:  Prior to Admission medications   Medication Sig Start Date End Date Taking? Authorizing Provider  amphetamine-dextroamphetamine (ADDERALL XR) 30 MG 24 hr capsule Take 1 capsule by mouth daily. 01/25/17   Historical Provider, MD  HYDROcodone-acetaminophen (NORCO) 5-325 MG tablet Take 1 tablet by mouth every 4 (four) hours as needed for moderate pain. 02/12/17   Willy Eddy, MD  HYDROcodone-acetaminophen (NORCO/VICODIN) 5-325 MG tablet Take 1 tablet by mouth every 4 (four) hours as needed for moderate pain. 02/05/17   Lattie Haw, MD  MYDAYIS 50 MG CP24 Take 1 capsule by mouth daily. 01/28/17   Historical Provider, MD  polyethylene glycol (MIRALAX / GLYCOLAX) packet Take 17 g by mouth daily. Mix one tablespoon with 8oz of your favorite juice or water every day until you are having soft formed stools.  Then start taking once daily if you didn't have a stool the day before. 02/12/17   Willy Eddy, MD     ALLERGIES:  No Known Allergies   SOCIAL HISTORY:  Social History   Social History  . Marital status: Single    Spouse name: N/A  . Number of children: N/A  . Years of education: N/A   Occupational History  . Not on file.   Social History Main Topics  . Smoking status: Never Smoker  . Smokeless tobacco: Never Used  . Alcohol use Yes  . Drug use: Unknown  . Sexual activity: Not on file   Other Topics Concern  . Not on file   Social History Narrative  . No narrative on file    The patient currently resides (home / rehab facility / nursing home): Home  The patient normally is (ambulatory / bedbound): Ambulatory   FAMILY HISTORY:  History reviewed. No pertinent family history.   REVIEW OF SYSTEMS:  Constitutional: denies weight loss, fever, chills, or sweats  Eyes: denies any other vision changes, history of eye injury  ENT: denies sore throat, hearing problems  Respiratory: denies shortness of breath, wheezing  Cardiovascular: denies chest pain, palpitations  Gastrointestinal: abdominal pain, N/V, and bowel function as per HPI Genitourinary: denies burning with urination or urinary frequency Musculoskeletal: denies any other joint pains or cramps  Skin: denies any other rashes or skin discolorations  Neurological: denies any other headache, dizziness, weakness  Psychiatric: denies any other depression, anxiety   All other review of systems were negative   VITAL SIGNS:  Temp:  [98.6 F (37 C)] 98.6 F (37 C) (04/22 1650) Pulse Rate:  [77] 77 (04/22 1650) Resp:  [18] 18 (04/22 1650) BP: (124)/(77) 124/77 (04/22 1650) SpO2:  [98 %] 98 % (04/22 1650) Weight:  [260 lb (117.9 kg)] 260 lb (117.9 kg) (04/22 1650)     Height:  (167.6 cm) Weight: 260 lb (117.9 kg) BMI (Calculated): 42.1   INTAKE/OUTPUT:  This shift: No intake/output data recorded.  Last 2  shifts: @   PHYSICAL EXAM:  Constitutional:  -- Obese body habitus  -- Awake, alert, and oriented x3  Eyes:  -- Pupils equally round and reactive to light  -- No scleral icterus  Ear, nose, and throat:  -- No jugular venous distension Pulmonary:  -- No crackles  -- Equal breath sounds bilaterally -- Breathing non-labored at rest Cardiovascular:  -- S1, S2 present  -- No pericardial rubs Gastrointestinal:  -- Abdomen soft with mild LLQ peri-incisional tenderness to palpation, nondistended, no guarding/rebound -- Incisions are all well-approximated with no erythema or drainage at all; steri-strip no longer over LLQ laparoscopic port-site incision -- No abdominal masses appreciated, pulsatile or otherwise  Musculoskeletal and Integumentary:  -- Wounds or skin discoloration: None appreciated except as described above (GI) -- Extremities: B/L UE and LE FROM, hands and feet warm  Neurologic:  -- Motor function: intact and symmetric -- Sensation: intact and symmetric  Labs:  CBC:  Lab Results  Component Value Date   WBC 8.3 02/12/2017   RBC 4.57 02/12/2017   BMP:  Lab Results  Component Value Date   GLUCOSE 112 (H) 02/12/2017   CO2 21 (L) 02/12/2017   BUN 13 02/12/2017   CREATININE 0.75 02/12/2017   CALCIUM 8.9 02/12/2017     Imaging studies:  CT Abdomen and Pelvis with Contrast (02/12/2017) - personally reviewed 1. Mild skin thickening and underlying inflammatory stranding at the  left lower quadrant port site without abscess or other fluid collection. 2. Status post appendectomy. No acute abnormality of the abdomen or pelvis.  Assessment/Plan: (ICD-10's: G1.18) 31 y.o. female with Left lower quadrant peri-incisional abdominal pain 8 days s/p laparoscopic appendectomy, afebrile with no leukocytosis or signs/symptoms of infection or abscess, complicated by pertinent comorbidities including morbid obesity (BMI >42).   - follow-up in office this Wednesday,  02/15/2017 as patient reports is scheduled  - patient also reports her pain feels better following evaluation and that she feels better knowing some LLQ pain can be expected following her surgery   - patient instructed to call office if incision becomes red, if she notices fluid draining from the wound, if febrile, or if any questions/concerns  All of the above findings and recommendations were discussed with the patient, her family, and ED physician, and all of patient's and her family's questions were answered to their expressed satisfaction.  Thank you for the opportunity to participate in this patient's care.   -- Scherrie Gerlach Earlene Plater, MD, RPVI [place]: [facility] General Surgery - Partnering for exceptional care. Office: [phone]

## 2017-02-12 NOTE — ED Notes (Signed)
Patient presents to the ED with pain to incision site on left lower abdomen.  Patient states incision was from last weekend when she had her appendix removed laparoscopically.  Patient is in no obvious distress at this time.  No redness or puss noted to area.

## 2017-02-12 NOTE — ED Notes (Signed)
ED Provider at bedside. 

## 2017-02-15 ENCOUNTER — Ambulatory Visit (INDEPENDENT_AMBULATORY_CARE_PROVIDER_SITE_OTHER): Payer: 59 | Admitting: Surgery

## 2017-02-15 ENCOUNTER — Encounter: Payer: Self-pay | Admitting: Surgery

## 2017-02-15 VITALS — BP 110/72 | HR 80 | Temp 98.2°F | Wt 259.0 lb

## 2017-02-15 DIAGNOSIS — Z09 Encounter for follow-up examination after completed treatment for conditions other than malignant neoplasm: Secondary | ICD-10-CM

## 2017-02-15 NOTE — Patient Instructions (Signed)
Please call our office with any questions or concerns.  Please do not submerge in a tub, hot tub, or pool until incisions are completely sealed.  Use sun block to incision area over the next year if this area will be exposed to sun. This helps decrease scarring.  You may resume your normal activities on 02/18/2017. At that time- Listen to your body when lifting, if you have pain when lifting, stop and then try again in a few days. Pain after doing exercises or activities of daily living is normal as you get back in to your normal routine.  If you develop redness, drainage, or pain at incision sites- call our office immediately and speak with a nurse.

## 2017-02-16 NOTE — Progress Notes (Signed)
02/15/2017  HPI: Patient is s/p laparoscopic appendectomy with Dr. Excell Seltzer on 4/14.  She was discharged to home on 4/15 in good condition and presents today for further follow up.  She reports that she has some pain in the left lower quadrant incision, with some dimpling of the skin at the incision.  Denies any nausea or vomiting. Denies any fever or chills.  Reports good appetite and normal bowel movements.  Vital signs: BP 110/72   Pulse 80   Temp 98.2 F (36.8 C) (Oral)   Wt 117.5 kg (259 lb)   BMI 41.80 kg/m    Physical Exam: Constitutional:  No acute distress Abdomen:  Soft, nondistended, with some discomfort over the left lower quadrant incision.  That incision has skin dimpling around incision but otherwise no induration or erythema or drainage.  All other incisions are clean, dry, and intact.  Assessment/Plan: 31 yo female s/p laparoscopic appendectomy.  --Reassured the patient that the pain over the LLQ incision will continue to improve as the healing process continues.  The dimpling will improve as well.  Did inform the patient that if the pain is not improving over the next month, to let us know because we may have to go back to OR to cut that suture.  It may just be that the suture closing the incision is pinching too much of the peritoneum. --Otherwise the patient is doing well.  Reinforced instructions of no heavy lifting of more than 10-15 lbs for another 2.5 weeks.  She may shower and submerge wounds in pool/tub. --Patient may follow up with [place] on an as needed basis.   Howie Ill, MD [facility]

## 2017-02-21 NOTE — Telephone Encounter (Signed)
Payment has been received, documents have been faxed and scanned into EP[employer]

## 2017-03-08 DIAGNOSIS — G8918 Other acute postprocedural pain: Secondary | ICD-10-CM | POA: Insufficient documentation

## 2017-08-11 IMAGING — CT CT RENAL STONE PROTOCOL
2 of 4 series · 16 of 46 positions shown, 18 images · non-contrast
Comparison: None.

CLINICAL DATA: Initial evaluation for acute right lower quadrant
pain with decreased appetite.

EXAM:
CT ABDOMEN AND PELVIS WITHOUT CONTRAST
TECHNIQUE: Multidetector CT imaging of the abdomen and pelvis was performed
following the standard protocol without IV contrast.

[Series 2: stone full standard · axial · 0.88mm/px · z∈[-870,-430]mm · 13 of 98 slices shown, 15 images]
[im 5/98  soft-tissue]
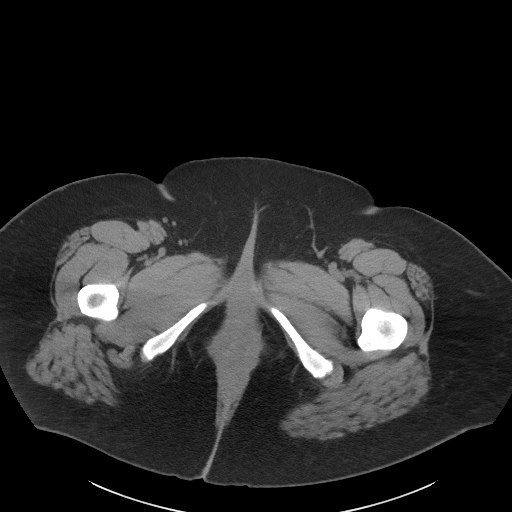
[im 5/98  bone]
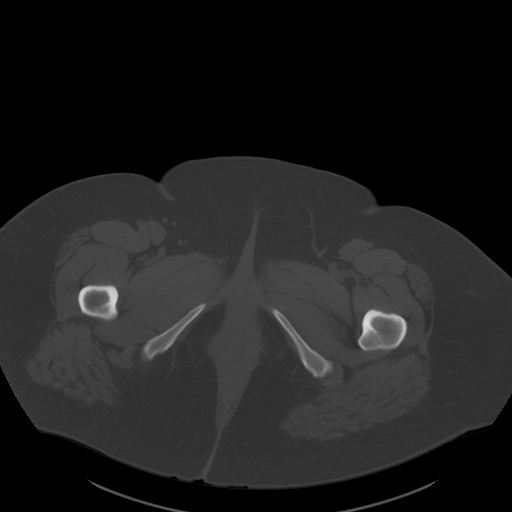
[im 13/98  soft-tissue]
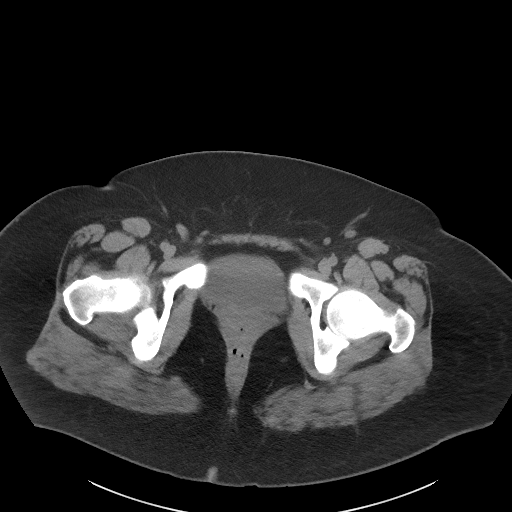
[im 21/98  soft-tissue]
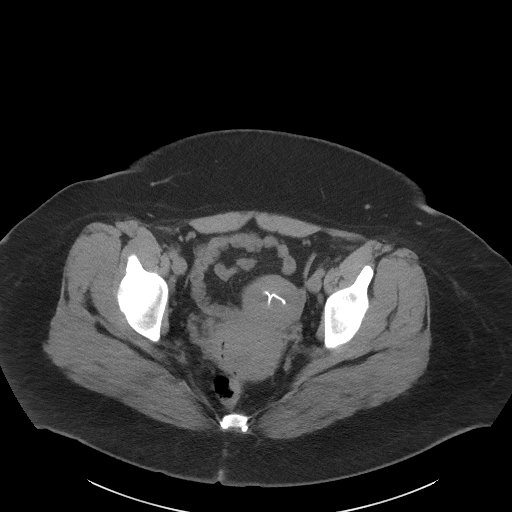
[im 29/98  soft-tissue]
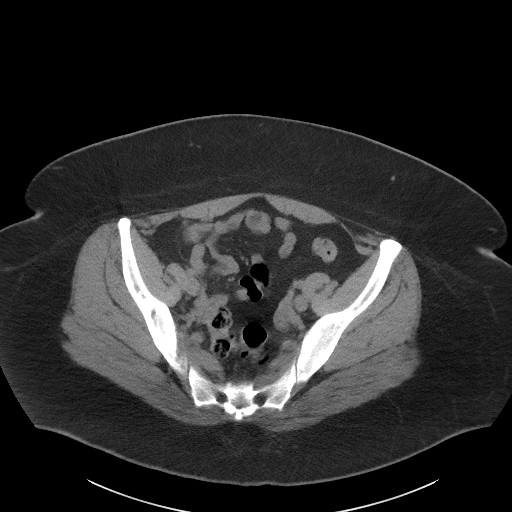
[im 33/98  soft-tissue]
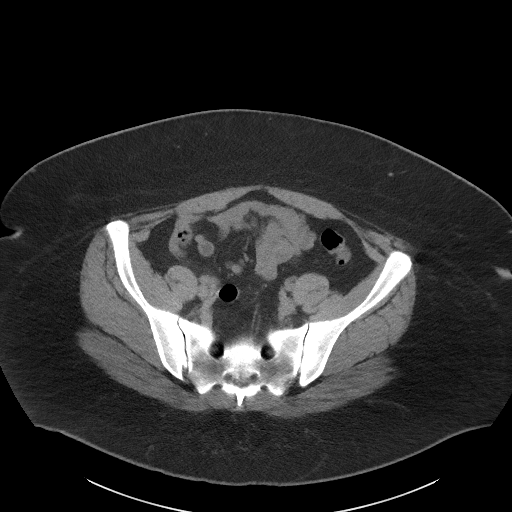
[im 41/98  soft-tissue]
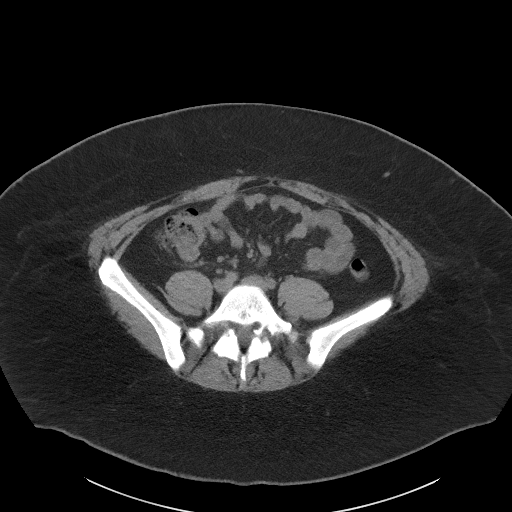
[im 49/98  soft-tissue]
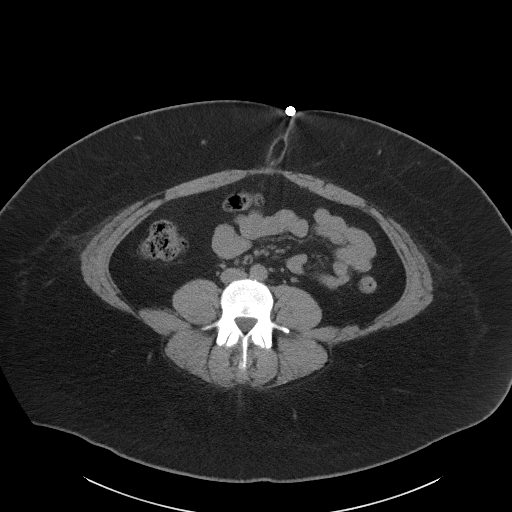
[im 57/98  soft-tissue]
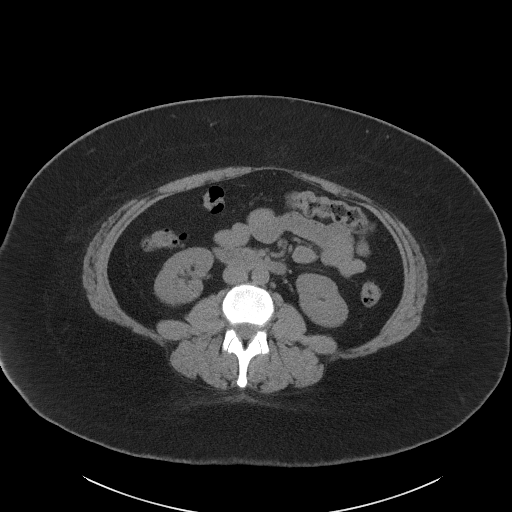
[im 65/98  soft-tissue]
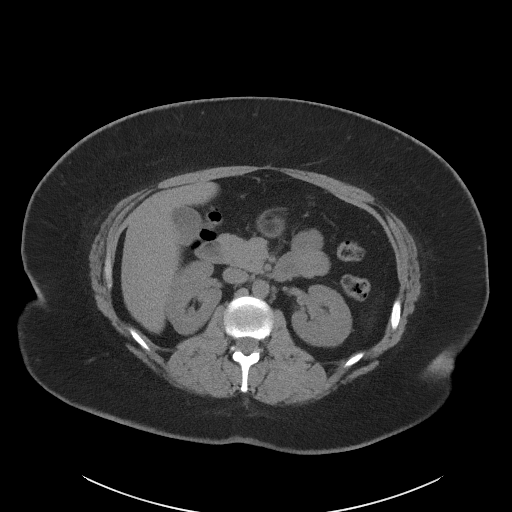
[im 65/98  bone]
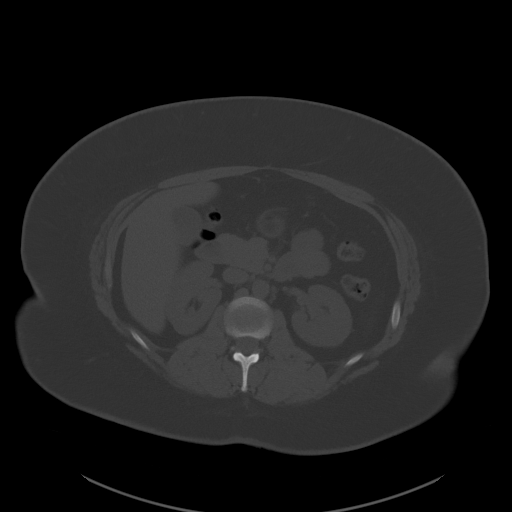
[im 69/98  soft-tissue]
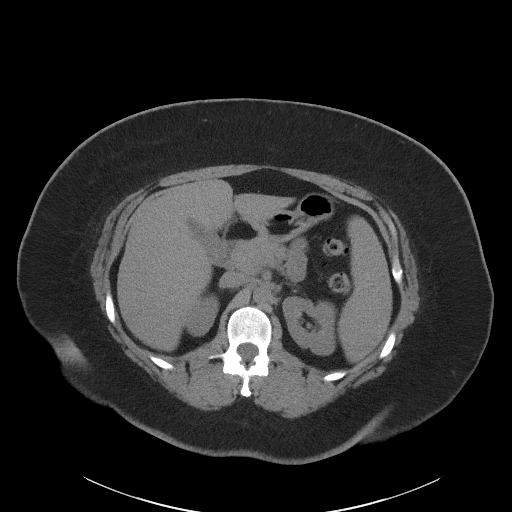
[im 77/98  soft-tissue]
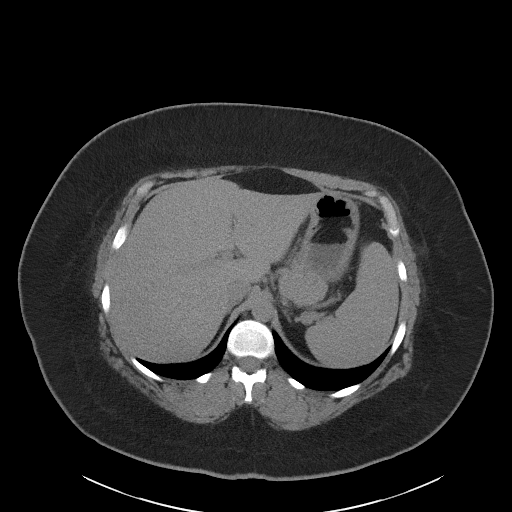
[im 85/98  soft-tissue]
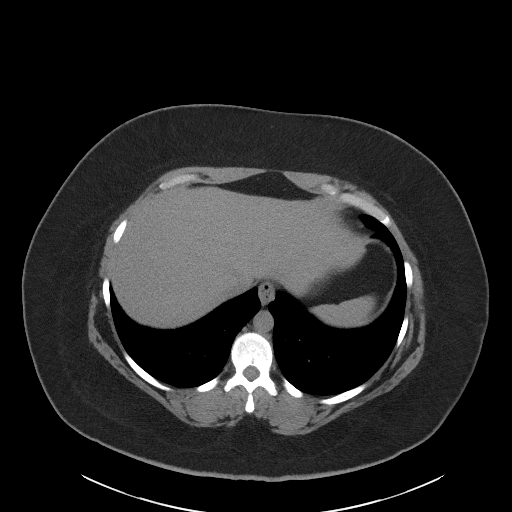
[im 93/98  soft-tissue]
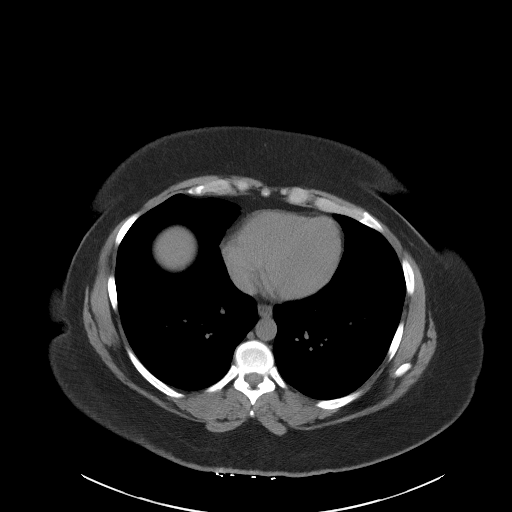

[Series 5: coronal · coronal · 0.91mm/px · 3 of 162 slices shown]
[im 54/162  soft-tissue]
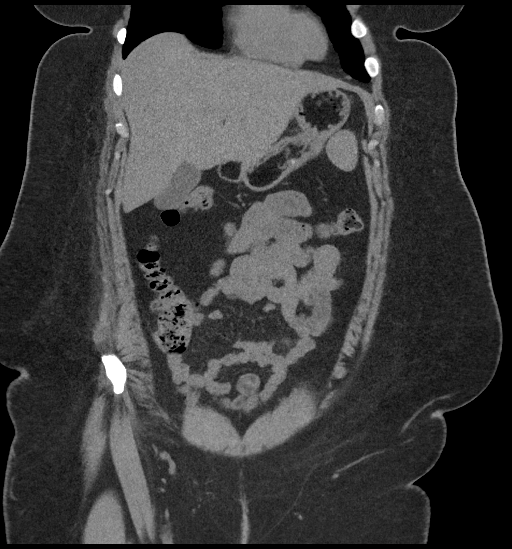
[im 72/162  soft-tissue]
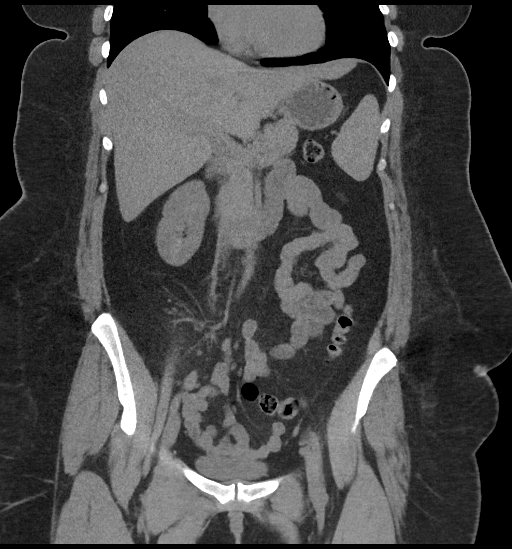
[im 90/162  soft-tissue]
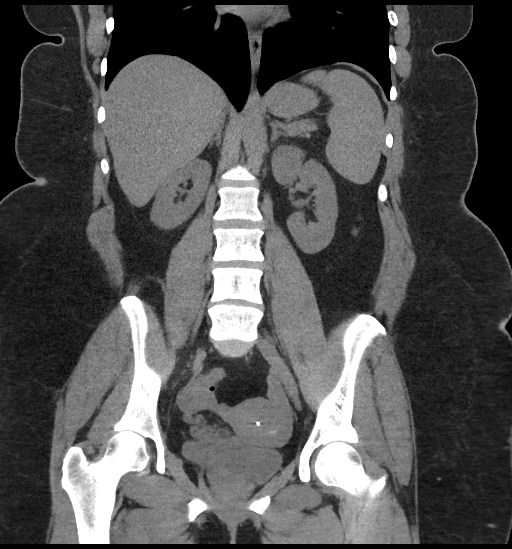

[16 of 46 positions shown; findings below may reference images not displayed]

FINDINGS: Lower chest: Visualized lung bases are clear.

Hepatobiliary: Liver demonstrates a normal unenhanced appearance.
Gallbladder normal. No biliary dilatation.

Pancreas: Pancreas within normal limits.

Spleen: Spleen within normal limits.

Adrenals/Urinary Tract: Adrenal glands are normal. Kidneys equal in
size without evidence for nephrolithiasis or hydronephrosis. No
radiopaque calculi seen along the course of either renal collecting
system. No hydroureter. Partially distended bladder within normal
limits. No layering stones within the bladder lumen

Stomach/Bowel: Stomach within normal limits. No evidence for bowel
obstruction. Appendix visualized at the base of the cecum. Appendix
is within normal limits for caliber without dilatation. There is
question of minimal subtle hazy stranding/induration about the base
of the appendix (series 5, image 68). Distally, the appendix is
unremarkable without acute inflammatory changes. Finding could
reflect the sequelae of mild and/or early/developing acute
appendicitis. No other acute inflammatory changes seen about the
bowels.

Vascular/Lymphatic: Intra-abdominal aorta of normal caliber. No
pathologically enlarged intra-abdominopelvic lymph nodes. Few mildly
prominent mesenteric lymph nodes seen clustered within the right
lower quadrant, which may be reactive.

Reproductive: IUD in place within the uterus. Uterus otherwise
unremarkable. 2.3 cm left ovarian cyst noted, likely a normal
physiologic cyst. Ovaries otherwise unremarkable.

Other: No free air or fluid. Small fat containing paraumbilical
hernia noted.

Musculoskeletal: No acute osseous abnormality. No worrisome lytic or
blastic osseous lesions. Chronic bilateral pars defects noted at L5
without significant spondylolisthesis.
IMPRESSION: 1. Subtle hazy stranding/induration about the base of the appendix.
While these changes are very mild at this point, findings raise the
possibility for mild and/or early/developing acute appendicitis.
Correlation with laboratory values and symptomatology recommended.
Additionally, a short interval follow-up scan to evaluate for
interval change may be helpful for further evaluation.
2. No other acute intra-abdominal or pelvic process.
3. No nephrolithiasis or obstructive uropathy.

## 2017-08-20 IMAGING — CT CT ABD-PELV W/ CM
2 of 5 series · 16 of 46 positions shown, 18 images · IV contrast (APPLIED)
Comparison: CT abdomen pelvis 02/03/2017

CLINICAL DATA: Left lower quadrant incisional pain. Status post
laparoscopic appendectomy.

EXAM:
CT ABDOMEN AND PELVIS WITH CONTRAST
TECHNIQUE: Multidetector CT imaging of the abdomen and pelvis was performed
using the standard protocol following bolus administration of
intravenous contrast.
CONTRAST:  125mL BR18MB-H55 IOPAMIDOL (BR18MB-H55) INJECTION 61%

[Series 2: routine abd/pel with · axial · 0.72mm/px · z∈[-622,-162]mm · 13 of 108 slices shown, 15 images]
[im 8/108  soft-tissue]
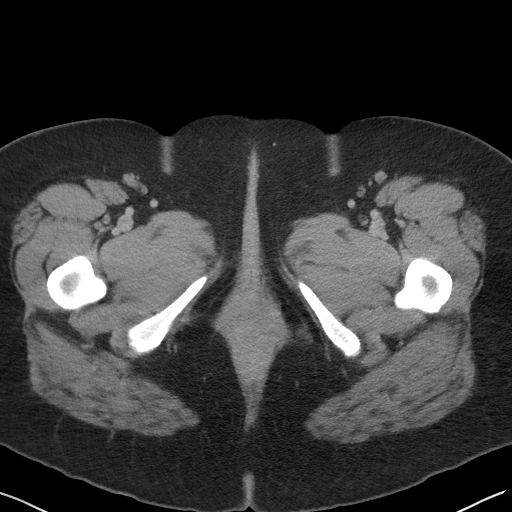
[im 8/108  bone]
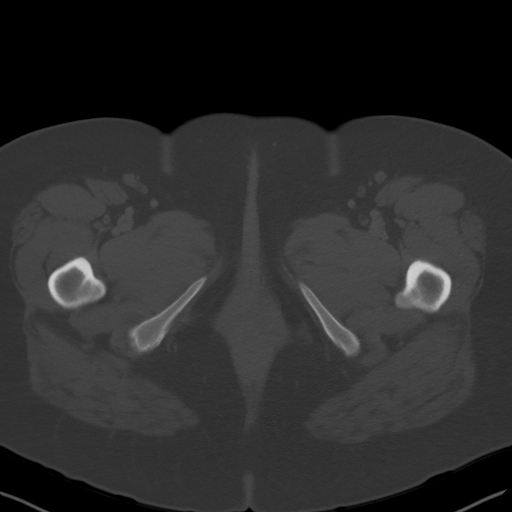
[im 16/108  soft-tissue]
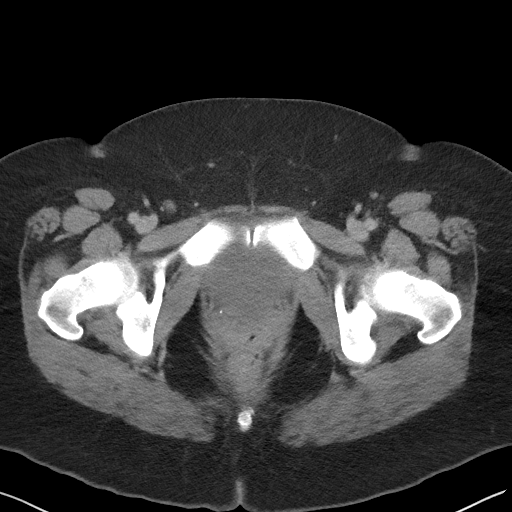
[im 23/108  soft-tissue]
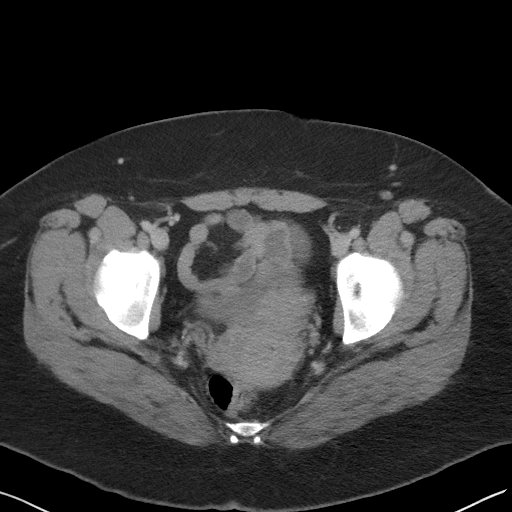
[im 31/108  soft-tissue]
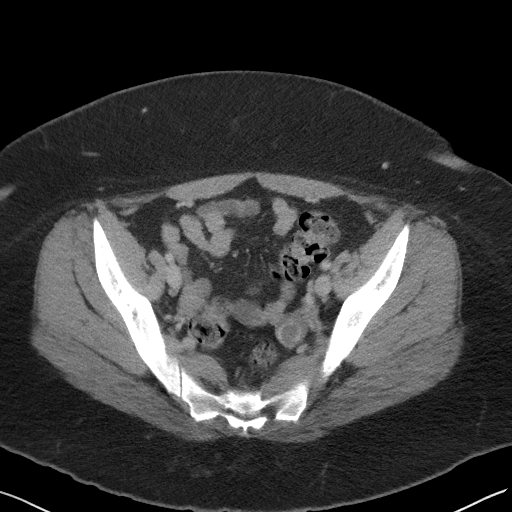
[im 39/108  soft-tissue]
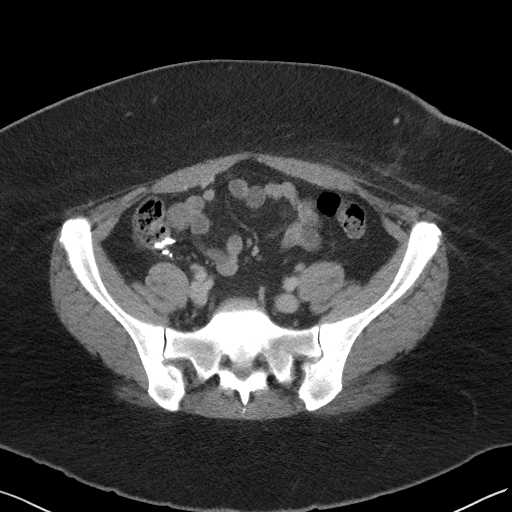
[im 46/108  soft-tissue]
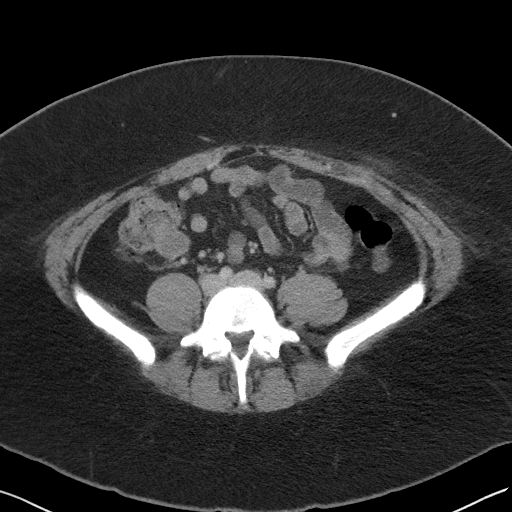
[im 54/108  soft-tissue]
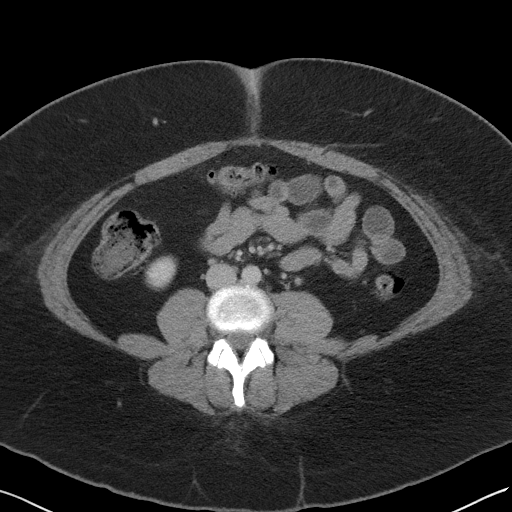
[im 62/108  soft-tissue]
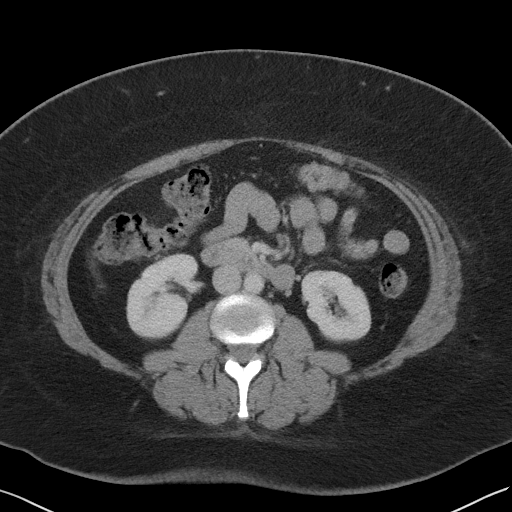
[im 69/108  soft-tissue]
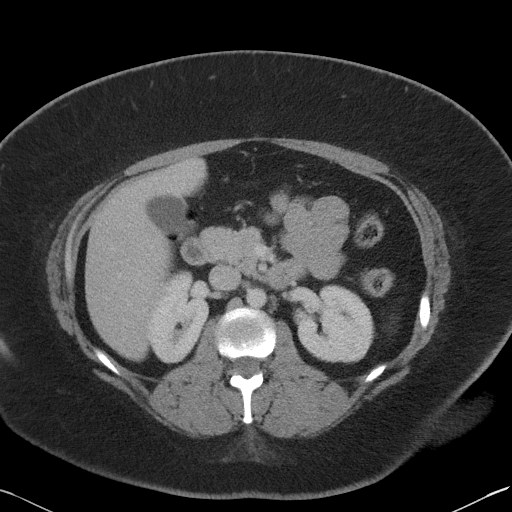
[im 69/108  bone]
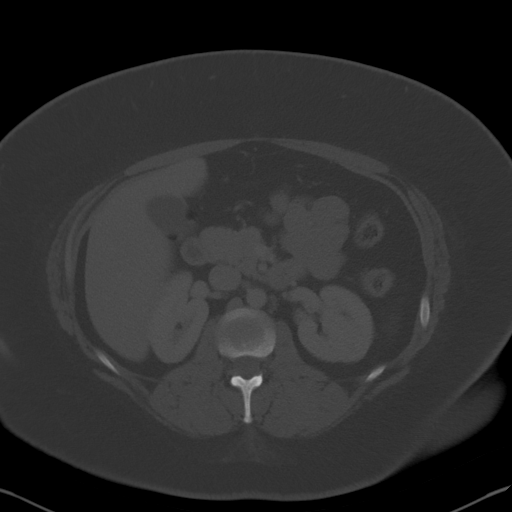
[im 77/108  soft-tissue]
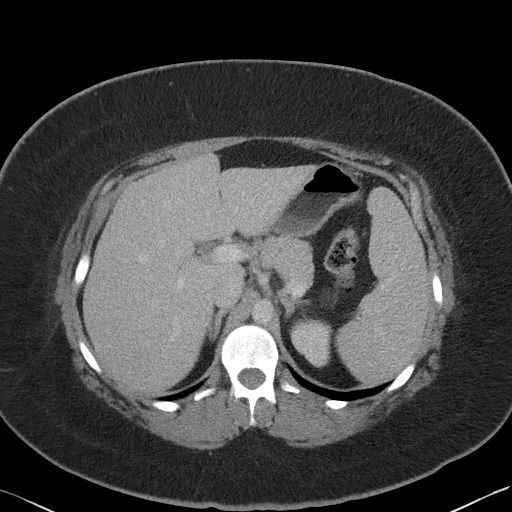
[im 85/108  soft-tissue]
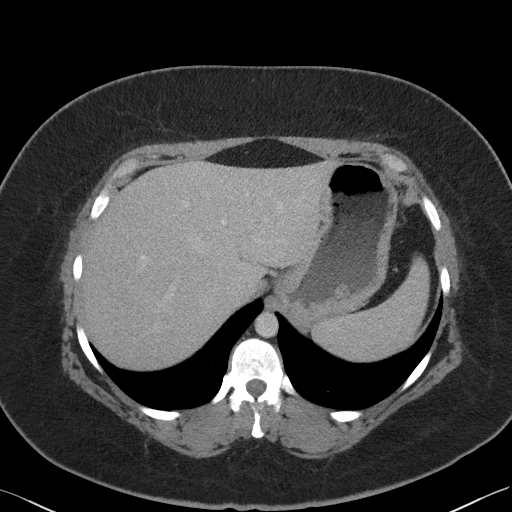
[im 92/108  soft-tissue]
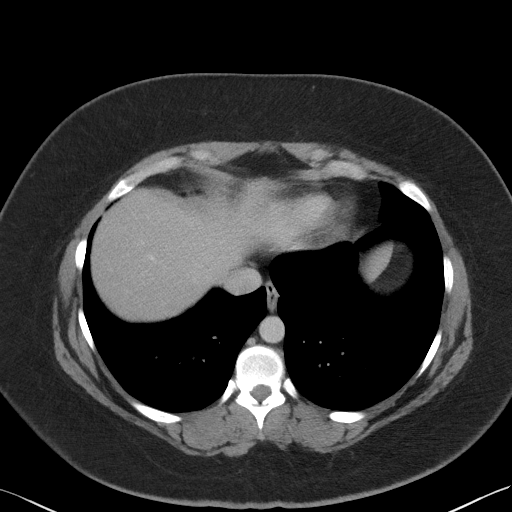
[im 100/108  soft-tissue]
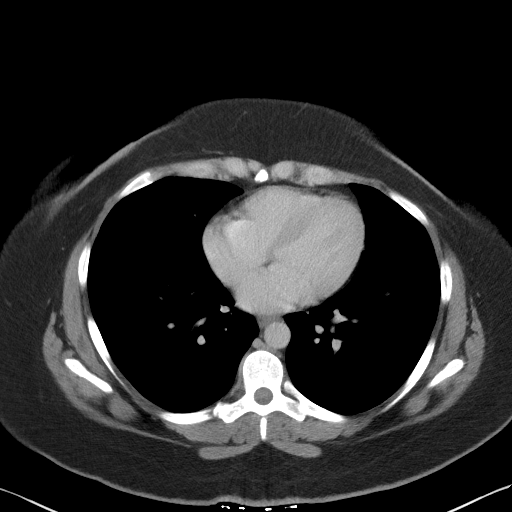

[Series 5: coronal st · coronal · 0.85mm/px · 3 of 106 slices shown]
[im 36/106  soft-tissue]
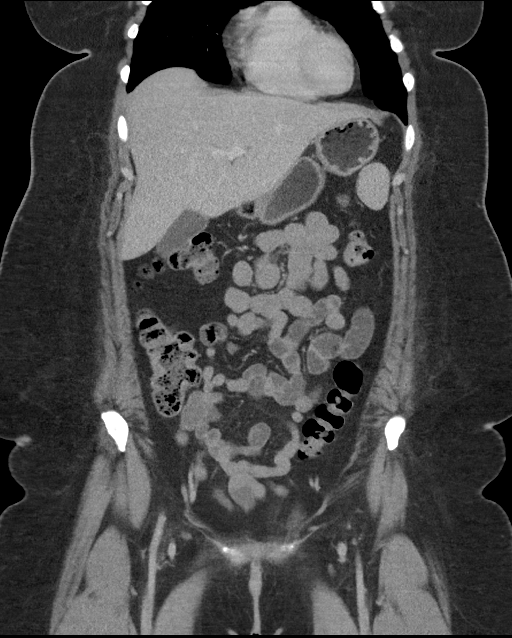
[im 47/106  soft-tissue]
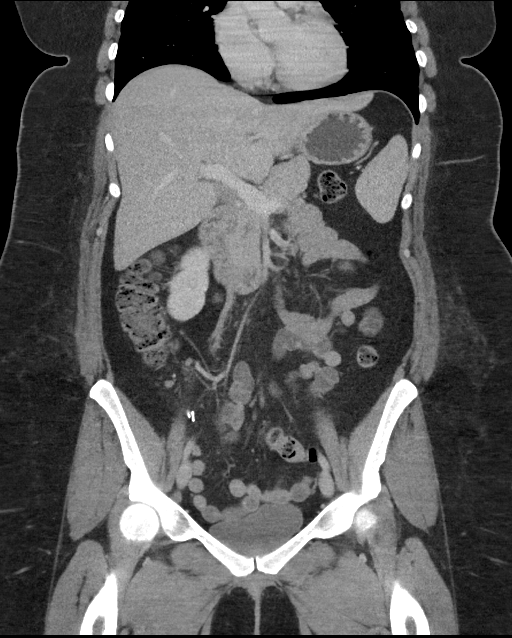
[im 59/106  soft-tissue]
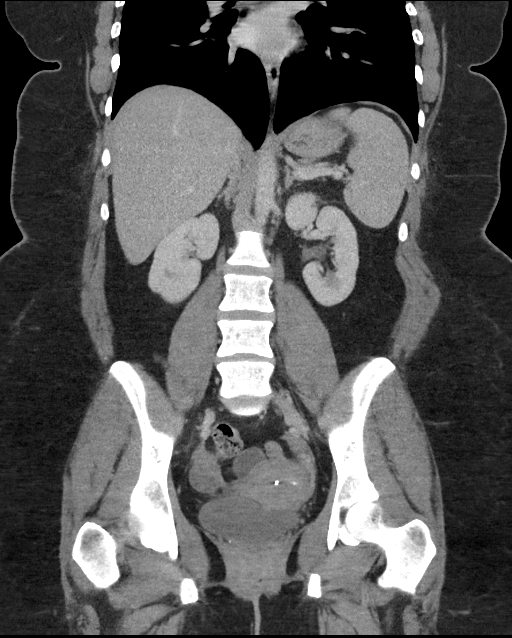

[16 of 46 positions shown; findings below may reference images not displayed]

FINDINGS: Lower chest: No pulmonary nodules. No visible pleural or pericardial
effusion.

Hepatobiliary: Normal hepatic size and contours without focal liver
lesion. No perihepatic ascites. No intra- or extrahepatic biliary
dilatation. Normal gallbladder.

Pancreas: Normal pancreatic contours and enhancement. No
peripancreatic fluid collection or pancreatic ductal dilatation.

Spleen: Normal.

Adrenals/Urinary Tract: Normal adrenal glands. No hydronephrosis or
solid renal mass.

Stomach/Bowel: No abnormal bowel dilatation. No bowel wall
thickening or adjacent fat stranding to indicate acute inflammation.
No abdominal fluid collection. The appendix is surgically absent.

Vascular/Lymphatic: Normal course and caliber of the major abdominal
vessels. No abdominal or pelvic adenopathy.

Reproductive: T-shaped contraceptive device is present within the
uterus. Left corpus luteum cyst is noted.

Musculoskeletal: There are bilateral pars interarticularis defects.
No lytic or blastic osseous lesion. There is minimal stranding
within the left lower quadrant subcutaneous fat, likely underlying
the surgical port site. No fluid collection or hematoma. Mild skin
thickening.

Other: No contributory non-categorized findings.
IMPRESSION: 1. Mild skin thickening and underlying inflammatory stranding at the
left lower quadrant port site without abscess or other fluid
collection.
2. Status post appendectomy. No acute abnormality of the abdomen or
pelvis.

## 2017-11-10 ENCOUNTER — Ambulatory Visit: Payer: Self-pay | Admitting: Allergy and Immunology

## 2017-11-23 ENCOUNTER — Ambulatory Visit: Payer: Self-pay | Admitting: Allergy and Immunology

## 2017-12-18 ENCOUNTER — Telehealth: Payer: Self-pay

## 2017-12-18 NOTE — Telephone Encounter (Signed)
[employer] disability form was filled out and faxed.
